# Patient Record
Sex: Female | Born: 1950 | Race: White | Hispanic: No | Marital: Married | Smoking: Never smoker
Health system: Southern US, Community
[De-identification: ages and names within clinical notes are randomized; demographics above are authoritative.]

## PROBLEM LIST (undated history)

## (undated) DIAGNOSIS — M1990 Unspecified osteoarthritis, unspecified site: Secondary | ICD-10-CM

## (undated) DIAGNOSIS — I10 Essential (primary) hypertension: Secondary | ICD-10-CM

## (undated) DIAGNOSIS — T7840XA Allergy, unspecified, initial encounter: Secondary | ICD-10-CM

## (undated) DIAGNOSIS — E785 Hyperlipidemia, unspecified: Secondary | ICD-10-CM

## (undated) DIAGNOSIS — R112 Nausea with vomiting, unspecified: Secondary | ICD-10-CM

## (undated) DIAGNOSIS — Z9889 Other specified postprocedural states: Secondary | ICD-10-CM

## (undated) HISTORY — PX: SPINE SURGERY: SHX786

## (undated) HISTORY — PX: HERNIA REPAIR: SHX51

## (undated) HISTORY — PX: BLADDER SUSPENSION: SHX72

## (undated) HISTORY — DX: Allergy, unspecified, initial encounter: T78.40XA

## (undated) HISTORY — PX: COLONOSCOPY: SHX174

## (undated) HISTORY — PX: COLONOSCOPY W/ BIOPSIES AND POLYPECTOMY: SHX1376

## (undated) HISTORY — PX: EYE SURGERY: SHX253

## (undated) HISTORY — PX: BACK SURGERY: SHX140

## (undated) HISTORY — DX: Essential (primary) hypertension: I10

## (undated) HISTORY — PX: ENDOMETRIAL ABLATION: SHX621

---
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
## 1971-05-24 HISTORY — PX: CHOLECYSTECTOMY: SHX55

## 1977-05-23 HISTORY — PX: TUBAL LIGATION: SHX77

## 1991-05-24 HISTORY — PX: LUMBAR DISC SURGERY: SHX700

## 1999-01-22 HISTORY — PX: MOLE REMOVAL: SHX2046

## 2004-12-23 ENCOUNTER — Ambulatory Visit: Payer: Self-pay | Admitting: Family Medicine

## 2005-03-07 ENCOUNTER — Ambulatory Visit: Payer: Self-pay | Admitting: Family Medicine

## 2005-08-15 ENCOUNTER — Ambulatory Visit: Payer: Self-pay | Admitting: Family Medicine

## 2006-03-20 ENCOUNTER — Ambulatory Visit: Payer: Self-pay | Admitting: Gastroenterology

## 2006-04-06 ENCOUNTER — Ambulatory Visit: Payer: Self-pay | Admitting: Gastroenterology

## 2006-04-28 ENCOUNTER — Ambulatory Visit: Payer: Self-pay | Admitting: Family Medicine

## 2006-10-06 ENCOUNTER — Ambulatory Visit: Payer: Self-pay | Admitting: Family Medicine

## 2011-03-14 ENCOUNTER — Encounter: Payer: Self-pay | Admitting: Internal Medicine

## 2011-05-30 ENCOUNTER — Encounter: Payer: Self-pay | Admitting: Internal Medicine

## 2011-06-27 ENCOUNTER — Ambulatory Visit (AMBULATORY_SURGERY_CENTER): Payer: BC Managed Care – PPO | Admitting: *Deleted

## 2011-06-27 ENCOUNTER — Encounter: Payer: Self-pay | Admitting: Internal Medicine

## 2011-06-27 VITALS — Ht 63.0 in | Wt 172.0 lb

## 2011-06-27 DIAGNOSIS — Z1211 Encounter for screening for malignant neoplasm of colon: Secondary | ICD-10-CM

## 2011-06-27 MED ORDER — PEG-KCL-NACL-NASULF-NA ASC-C 100 G PO SOLR: ORAL | Status: DC

## 2011-06-27 NOTE — Progress Notes (Signed)
No allergy to eggs or soy products 

## 2011-07-11 ENCOUNTER — Encounter: Payer: Self-pay | Admitting: Internal Medicine

## 2011-07-11 ENCOUNTER — Ambulatory Visit (AMBULATORY_SURGERY_CENTER): Payer: BC Managed Care – PPO | Admitting: Internal Medicine

## 2011-07-11 VITALS — BP 131/77 | HR 82 | Temp 96.8°F | Resp 16 | Ht 63.0 in | Wt 172.0 lb

## 2011-07-11 DIAGNOSIS — K5730 Diverticulosis of large intestine without perforation or abscess without bleeding: Secondary | ICD-10-CM

## 2011-07-11 DIAGNOSIS — Z800 Family history of malignant neoplasm of digestive organs: Secondary | ICD-10-CM

## 2011-07-11 DIAGNOSIS — Z1211 Encounter for screening for malignant neoplasm of colon: Secondary | ICD-10-CM

## 2011-07-11 DIAGNOSIS — Z86010 Personal history of colonic polyps: Secondary | ICD-10-CM

## 2011-07-11 NOTE — Patient Instructions (Signed)
YOU HAD AN ENDOSCOPIC PROCEDURE TODAY AT THE [facility]: Refer to the procedure report that was given to you for any specific questions about what was found during the examination.  If the procedure report does not answer your questions, please call your gastroenterologist to clarify.  If you requested that your care partner not be given the details of your procedure findings, then the procedure report has been included in a sealed envelope for you to review at your convenience later.  YOU SHOULD EXPECT: Some feelings of bloating in the abdomen. Passage of more gas than usual.  Walking can help get rid of the air that was put into your GI tract during the procedure and reduce the bloating. If you had a lower endoscopy (such as a colonoscopy or flexible sigmoidoscopy) you may notice spotting of blood in your stool or on the toilet paper. If you underwent a bowel prep for your procedure, then you may not have a normal bowel movement for a few days.  DIET: Your first meal following the procedure should be a light meal and then it is ok to progress to your normal diet.  A half-sandwich or bowl of soup is an example of a good first meal.  Heavy or fried foods are harder to digest and may make you feel nauseous or bloated.  Likewise meals heavy in dairy and vegetables can cause extra gas to form and this can also increase the bloating.  Drink plenty of fluids but you should avoid alcoholic beverages for 24 hours.  ACTIVITY: Your care partner should take you home directly after the procedure.  You should plan to take it easy, moving slowly for the rest of the day.  You can resume normal activity the day after the procedure however you should NOT DRIVE or use heavy machinery for 24 hours (because of the sedation medicines used during the test).    SYMPTOMS TO REPORT IMMEDIATELY: A gastroenterologist can be reached at any hour.  During normal business hours, 8:30 AM to 5:00 PM Monday through Friday,  call [phone].  After hours and on weekends, please call the GI answering service at [phone] who will take a message and have the physician on call contact you.   Following lower endoscopy (colonoscopy or flexible sigmoidoscopy):  Excessive amounts of blood in the stool  Significant tenderness or worsening of abdominal pains  Swelling of the abdomen that is new, acute  Fever of 100F or higher  Following upper endoscopy (EGD)  Vomiting of blood or coffee ground material  New chest pain or pain under the shoulder blades  Painful or persistently difficult swallowing  New shortness of breath  Fever of 100F or higher  Black, tarry-looking stools  FOLLOW UP: If any biopsies were taken you will be contacted by phone or by letter within the next 1-3 weeks.  Call your gastroenterologist if you have not heard about the biopsies in 3 weeks.  Our staff will call the home number listed on your records the next business day following your procedure to check on you and address any questions or concerns that you may have at that time regarding the information given to you following your procedure. This is a courtesy call and so if there is no answer at the home number and we have not heard from you through the emergency physician on call, we will assume that you have returned to your regular daily activities without incident.  SIGNATURES/CONFIDENTIALITY: You and/or your care   partner have signed paperwork which will be entered into your electronic medical record.  These signatures attest to the fact that that the information above on your After Visit Summary has been reviewed and is understood.  Full responsibility of the confidentiality of this discharge information lies with you and/or your care-partner.  

## 2011-07-11 NOTE — Op Note (Signed)
[facility] [address] [place], [place]  [address]  COLONOSCOPY PROCEDURE REPORT  PATIENT:  Maria Briggs, Maria Briggs  MR#:  [id number] BIRTHDATE:  05/27/1950, 60 yrs. old  GENDER:  female ENDOSCOPIST:  Wilhemina Bonito. Eda Keys, MD REF. BY:  Surveillance Program Recall, PROCEDURE DATE:  07/11/2011 PROCEDURE:  Surveillance Colonoscopy ASA CLASS:  Class II INDICATIONS:  history of pre-cancerous (adenomatous) colon polyps, surveillance and high-risk screening, family history of colon cancer ; Father w/ CRC. Patient's index exam 06-2001 (TA); f/u 11-07 MEDICATIONS:   MAC sedation, administered by CRNA, propofol (Diprivan) 240 mg IV  DESCRIPTION OF PROCEDURE:   After the risks benefits and alternatives of the procedure were thoroughly explained, informed consent was obtained.  Digital rectal exam was performed and revealed no abnormalities.   The LB CF-[id number] endoscope was introduced through the anus and advanced to the cecum, which was identified by both the appendix and ileocecal valve, without limitations.  The quality of the prep was excellent, using MoviPrep.  The instrument was then slowly withdrawn as the colon was fully examined. <<PROCEDUREIMAGES>>  FINDINGS:  Severe diverticulosis was found throughout the colon. Otherwise normal colonoscopy without  polyps, masses, vascular ectasias, or inflammatory changes.   Retroflexed views in the rectum revealed internal hemorrhoids.   The time to cecum = 4:56 minutes. The scope was then withdrawn in  8:55  minutes from the cecum and the procedure completed.  COMPLICATIONS:  None  ENDOSCOPIC IMPRESSION: 1) Severe diverticulosis throughout the colon 2) Otherwise normal colonoscopy 3 RECOMMENDATIONS: 1) Follow up colonoscopy in 5 years  ______________________________ Wilhemina Bonito. Eda Keys, MD  CC:  Joette Catching, MD;   The Patient  n. eSIGNED:   Wilhemina Bonito. Eda Keys at 07/11/2011 09:08 AM  Lurena Nida, [id number]

## 2011-07-12 ENCOUNTER — Telehealth: Payer: Self-pay

## 2011-07-12 NOTE — Telephone Encounter (Signed)
  Follow up Call-  Call back number 07/11/2011  Post procedure Call Back phone  # [phone]  Permission to leave phone message Yes     Patient questions:  Do you have a fever, pain , or abdominal swelling? no Pain Score  0 *  Have you tolerated food without any problems? yes  Have you been able to return to your normal activities? yes  Do you have any questions about your discharge instructions: Diet   no Medications  no Follow up visit  no  Do you have questions or concerns about your Care? yes  Actions: * If pain score is 4 or above: No action needed, pain <4.

## 2014-12-08 ENCOUNTER — Other Ambulatory Visit: Payer: Self-pay | Admitting: Surgery

## 2015-02-11 ENCOUNTER — Encounter (HOSPITAL_COMMUNITY)
Admission: RE | Admit: 2015-02-11 | Discharge: 2015-02-11 | Disposition: A | Discharge status: Home / Self Care | Payer: BLUE CROSS/BLUE SHIELD | Source: Ambulatory Visit | Attending: Surgery | Admitting: Surgery

## 2015-02-11 ENCOUNTER — Encounter (HOSPITAL_COMMUNITY): Payer: Self-pay

## 2015-02-11 DIAGNOSIS — Z01812 Encounter for preprocedural laboratory examination: Secondary | ICD-10-CM | POA: Insufficient documentation

## 2015-02-11 DIAGNOSIS — K432 Incisional hernia without obstruction or gangrene: Secondary | ICD-10-CM | POA: Diagnosis not present

## 2015-02-11 HISTORY — DX: Nausea with vomiting, unspecified: R11.2

## 2015-02-11 HISTORY — DX: Hyperlipidemia, unspecified: E78.5

## 2015-02-11 HISTORY — DX: Nausea with vomiting, unspecified: Z98.890

## 2015-02-11 LAB — CBC
HEMATOCRIT: 42.4 % (ref 36.0–46.0)
HEMOGLOBIN: 13.7 g/dL (ref 12.0–15.0)
MCH: 30.1 pg (ref 26.0–34.0)
MCHC: 32.3 g/dL (ref 30.0–36.0)
MCV: 93.2 fL (ref 78.0–100.0)
Platelets: 169 10*3/uL (ref 150–400)
RBC: 4.55 MIL/uL (ref 3.87–5.11)
RDW: 13.4 % (ref 11.5–15.5)
WBC: 5.2 10*3/uL (ref 4.0–10.5)

## 2015-02-11 NOTE — Progress Notes (Signed)
Patient denies any heart cath, ekg, echo, stress test, ever having seen a cardiologist.  pcp is Sherrie Mustache, MD

## 2015-02-11 NOTE — Pre-Procedure Instructions (Signed)
    Maria Briggs  02/11/2015      [place]  [address] Phone: [phone] Fax: [phone]    Your procedure is scheduled on Friday February 20 2015 at 100 pm.  Report to [facility] at 1100 A.M.  Call this number if you have problems the morning of surgery:  [phone]   Remember:  Do not eat food or drink liquids after midnight Thursday September 29th 2016.  Take these medicines the morning of surgery with A SIP OF WATER Colsevelam (Welchol)  STOP: ALL Vitamins, Supplements, Effient and Herbal Medications, Fish Oils, Aspirins, NSAIDs (Nonsteroidal Anti-inflammatories such as Ibuprofen, Aleve, or Advil), and Goody's/BC Powders 7 days prior to surgery, until after surgery as directed by your physician.     Do not wear jewelry, make-up or nail polish.  Do not wear lotions, powders, or perfumes.  You may wear deodorant.  Do not shave 48 hours prior to surgery.    Do not bring valuables to the hospital.  [facility] is not responsible for any belongings or valuables.  Contacts, dentures or bridgework may not be worn into surgery.  Leave your suitcase in the car.  After surgery it may be brought to your room.  For patients admitted to the hospital, discharge time will be determined by your treatment team.  Patients discharged the day of surgery will not be allowed to drive home.   Special instructions:  Please follow these instructions carefully:  1. Shower with CHG Soap the night before surgery and the morning of Surgery. 2. If you choose to wash your hair, wash your hair first as usual with your normal shampoo. 3. After you shampoo, rinse your hair and body thoroughly to remove the Shampoo. 4. Use CHG as you would any other liquid soap. You can apply chg directly to the skin and wash gently with scrungie or a clean  washcloth. 5. Apply the CHG Soap to your body ONLY FROM THE NECK DOWN. Do not use on open wounds or open sores. Avoid contact with your eyes, ears, mouth and genitals (private parts). Wash genitals (private parts) with your normal soap. 6. Wash thoroughly, paying special attention to the area where your surgery will be performed. 7. Thoroughly rinse your body with warm water from the neck down. 8. DO NOT shower/wash with your normal soap after using and rinsing off the CHG Soap. 9. Pat yourself dry with a clean towel.  10. Wear clean pajamas.  11. Place clean sheets on your bed the night of your first shower and do not sleep with pets.  Day of Surgery  Do not apply any lotions/deodorants the morning of surgery. Please wear clean clothes to the hospital/surgery center.    Please read over the following fact sheets that you were given. Pain Booklet, Coughing and Deep Breathing and Surgical Site Infection Prevention

## 2015-02-19 MED ORDER — CIPROFLOXACIN IN D5W 400 MG/200ML IV SOLN
400.0000 mg | INTRAVENOUS | Status: AC
  Administered 2015-02-20: 400 mg via INTRAVENOUS
  Filled 2015-02-19: qty 200

## 2015-02-19 NOTE — H&P (Signed)
Maria Briggs  Location: [facility] Patient #: [id number] DOB: 1951-05-22 Married / Language: English / Race: White Female  History of Present Illness (Maria Briggs A. Ninfa Linden MD; Patient words: abd hernia.  The patient is a 64 year old female who presents with an incisional hernia. She is here today for evaluation of incisional hernia. She is referred by Dr. Dione Housekeeper. She had a laparoscopic cholecystectomy more than 20 years ago. She has developed hernias at the epigastrium and at the umbilicus. She has had them for many years. They're slowly getting larger and causing some mild burning discomfort. She has no obstructive symptoms. The pain does not refer anywhere else. She denies nausea or vomiting. She is otherwise doing well.   Other Problems Elbert Ewings, CMA; Hypercholesterolemia Umbilical Hernia Repair  Past Surgical History Elbert Ewings, CMA; Colon Polyp Removal - Colonoscopy Gallbladder Surgery - Open Oral Surgery Spinal Surgery - Lower Back Tonsillectomy  Diagnostic Studies History Elbert Ewings, CMA;  Colonoscopy 1-5 years ago Mammogram within last year Pap Smear 1-5 years ago  Allergies Elbert Ewings, [place];  Penicillin VK *PENICILLINS* Statins Depletion *DIETARY PRODUCTS/DIETARY MANAGEMENT PRODUCTS*  Medication History Elbert Ewings, CMA; Welchol (625MG  Tablet, Oral) Active. Nitrofurantoin Monohyd Macro (100MG  Capsule, Oral) Active. Fluticasone Propionate (50MCG/ACT Suspension, Nasal) Active. Medications Reconciled  Social History Elbert Ewings, [place]; Alcohol use Occasional alcohol use. No drug use Tobacco use Never smoker.  Family History Elbert Ewings, [place]; Colon Cancer Father. Colon Polyps Father. Prostate Cancer Father.  Pregnancy / Birth History Elbert Ewings, [place];  Age of menopause 51-55 Contraceptive History Oral contraceptives. Para 2  Review of Systems Elbert Ewings CMA;  General Not Present-  Appetite Loss, Chills, Fatigue, Fever, Night Sweats, Weight Gain and Weight Loss. HEENT Present- Wears glasses/contact lenses. Not Present- Earache, Hearing Loss, Hoarseness, Nose Bleed, Oral Ulcers, Ringing in the Ears, Seasonal Allergies, Sinus Pain, Sore Throat, Visual Disturbances and Yellow Eyes. Respiratory Not Present- Bloody sputum, Chronic Cough, Difficulty Breathing, Snoring and Wheezing. Breast Not Present- Breast Mass, Breast Pain, Nipple Discharge and Skin Changes. Gastrointestinal Not Present- Abdominal Pain, Bloating, Bloody Stool, Change in Bowel Habits, Chronic diarrhea, Constipation, Difficulty Swallowing, Excessive gas, Gets full quickly at meals, Hemorrhoids, Indigestion, Nausea, Rectal Pain and Vomiting. Female Genitourinary Not Present- Frequency, Nocturia, Painful Urination, Pelvic Pain and Urgency. Musculoskeletal Not Present- Back Pain, Joint Pain, Joint Stiffness, Muscle Pain, Muscle Weakness and Swelling of Extremities.   Vitals Elbert Ewings CMA;   Weight: 181 lb Height: 63in Body Surface Area: 1.91 m Body Mass Index: 32.06 kg/m Temp.: 98.65F(Oral)  Pulse: 78 (Regular)  BP: 128/70 (Sitting, Left Arm, Standard)    Physical Exam (Stayce Delancy A. Ninfa Linden MD; General Mental Status-Alert. General Appearance-Consistent with stated age. Hydration-Well hydrated. Voice-Normal.  Head and Neck Head-normocephalic, atraumatic with no lesions or palpable masses. Trachea-midline.  Eye Eyeball - Bilateral-Extraocular movements intact. Sclera/Conjunctiva - Bilateral-No scleral icterus.  Chest and Lung Exam Chest and lung exam reveals -quiet, even and easy respiratory effort with no use of accessory muscles and on auscultation, normal breath sounds, no adventitious sounds and normal vocal resonance. Inspection Chest Wall - Normal. Back - normal.  Cardiovascular Cardiovascular examination reveals -normal heart sounds, regular rate and  rhythm with no murmurs and normal pedal pulses bilaterally.  Abdomen Inspection Skin - Scar - no surgical scars. Hernias - Ventral - Reducible. Note: There is an incisional hernia in her epigastrium midway between the xiphoid and the umbilicus which feels like it contains omentum. There is also an incisional hernia  at the umbilicus. Umbilical hernia - Reducible. Palpation/Percussion Palpation and Percussion of the abdomen reveal - Soft, Non Tender, No Rebound tenderness, No Rigidity (guarding) and No hepatosplenomegaly. Auscultation Auscultation of the abdomen reveals - Bowel sounds normal.  Neurologic Neurologic evaluation reveals -alert and oriented x 3 with no impairment of recent or remote memory. Mental Status-Normal.  Musculoskeletal Normal Exam - Left-Upper Extremity Strength Normal and Lower Extremity Strength Normal. Normal Exam - Right-Upper Extremity Strength Normal, Lower Extremity Weakness.    Assessment & Plan (Hillery Zachman A. Ninfa Linden MD  INCISIONAL HERNIA [phone])  Impression: I discussed the diagnosis with her in detail. I discussed surgical repair of the hernia. I discussed laparoscopic incisional hernia repair with mesh. I discussed the risks of surgery which includes but is not limited to bleeding, infection, injury to surrounding structures, the need to convert to an open procedure, recurrence, DVT, cardiopulmonary issues, postoperative recovery, etc. She understands and wishes to proceed with surgery. Educational literature was given.

## 2015-02-20 ENCOUNTER — Ambulatory Visit (HOSPITAL_COMMUNITY): Payer: BLUE CROSS/BLUE SHIELD | Admitting: Certified Registered"

## 2015-02-20 ENCOUNTER — Observation Stay (HOSPITAL_COMMUNITY)
Admission: RE | Admit: 2015-02-20 | Discharge: 2015-02-21 | Disposition: A | Discharge status: Home / Self Care | Payer: BLUE CROSS/BLUE SHIELD | Source: Ambulatory Visit | Attending: Surgery | Admitting: Surgery

## 2015-02-20 ENCOUNTER — Encounter (HOSPITAL_COMMUNITY): Payer: Self-pay | Admitting: *Deleted

## 2015-02-20 ENCOUNTER — Encounter (HOSPITAL_COMMUNITY)
Admission: RE | Disposition: A | Discharge status: Home / Self Care | Payer: Self-pay | Source: Ambulatory Visit | Attending: Surgery

## 2015-02-20 DIAGNOSIS — K432 Incisional hernia without obstruction or gangrene: Secondary | ICD-10-CM | POA: Diagnosis not present

## 2015-02-20 HISTORY — DX: Unspecified osteoarthritis, unspecified site: M19.90

## 2015-02-20 HISTORY — PX: LAPAROSCOPIC INCISIONAL / UMBILICAL / VENTRAL HERNIA REPAIR: SUR789

## 2015-02-20 HISTORY — PX: INCISIONAL HERNIA REPAIR: SHX193

## 2015-02-20 HISTORY — PX: INSERTION OF MESH: SHX5868

## 2015-02-20 SURGERY — REPAIR, HERNIA, INCISIONAL, LAPAROSCOPIC
Anesthesia: General | Site: Abdomen

## 2015-02-20 MED ORDER — MORPHINE SULFATE (PF) 2 MG/ML IV SOLN
1.0000 mg | INTRAVENOUS | Status: DC | PRN
  Administered 2015-02-20: 2 mg via INTRAVENOUS
  Administered 2015-02-20 – 2015-02-21 (×2): 4 mg via INTRAVENOUS
  Filled 2015-02-20: qty 1
  Filled 2015-02-20 (×2): qty 2

## 2015-02-20 MED ORDER — 0.9 % SODIUM CHLORIDE (POUR BTL) OPTIME
TOPICAL | Status: DC | PRN
  Administered 2015-02-20: 1000 mL

## 2015-02-20 MED ORDER — COLESEVELAM HCL 625 MG PO TABS
1875.0000 mg | ORAL_TABLET | Freq: Two times a day (BID) | ORAL | Status: DC
  Administered 2015-02-21: 1875 mg via ORAL
  Filled 2015-02-20 (×2): qty 3

## 2015-02-20 MED ORDER — ONDANSETRON HCL 4 MG/2ML IJ SOLN
4.0000 mg | Freq: Four times a day (QID) | INTRAMUSCULAR | Status: DC | PRN
  Administered 2015-02-20: 4 mg via INTRAVENOUS
  Filled 2015-02-20: qty 2

## 2015-02-20 MED ORDER — MEPERIDINE HCL 25 MG/ML IJ SOLN: 6.2500 mg | INTRAMUSCULAR | Status: DC | PRN

## 2015-02-20 MED ORDER — MIDAZOLAM HCL 2 MG/2ML IJ SOLN
INTRAMUSCULAR | Status: AC
  Filled 2015-02-20: qty 2

## 2015-02-20 MED ORDER — OXYCODONE HCL 5 MG PO TABS
5.0000 mg | ORAL_TABLET | ORAL | Status: DC | PRN
  Administered 2015-02-20 – 2015-02-21 (×3): 10 mg via ORAL
  Filled 2015-02-20 (×3): qty 2

## 2015-02-20 MED ORDER — DEXAMETHASONE SODIUM PHOSPHATE 10 MG/ML IJ SOLN
INTRAMUSCULAR | Status: DC | PRN
  Administered 2015-02-20: 8 mg via INTRAVENOUS

## 2015-02-20 MED ORDER — BUPIVACAINE-EPINEPHRINE 0.25% -1:200000 IJ SOLN
INTRAMUSCULAR | Status: DC | PRN
  Administered 2015-02-20: 20 mL

## 2015-02-20 MED ORDER — OXYCODONE HCL 5 MG PO TABS
ORAL_TABLET | ORAL | Status: AC
  Administered 2015-02-20: 10 mg via ORAL
  Filled 2015-02-20: qty 2

## 2015-02-20 MED ORDER — PROPOFOL 10 MG/ML IV BOLUS
INTRAVENOUS | Status: DC | PRN
  Administered 2015-02-20: 140 mg via INTRAVENOUS

## 2015-02-20 MED ORDER — LIDOCAINE HCL (CARDIAC) 20 MG/ML IV SOLN
INTRAVENOUS | Status: DC | PRN
  Administered 2015-02-20: 80 mg via INTRAVENOUS

## 2015-02-20 MED ORDER — ONDANSETRON HCL 4 MG/2ML IJ SOLN
INTRAMUSCULAR | Status: DC | PRN
  Administered 2015-02-20: 4 mg via INTRAVENOUS

## 2015-02-20 MED ORDER — PHENYLEPHRINE HCL 10 MG/ML IJ SOLN
INTRAMUSCULAR | Status: DC | PRN
  Administered 2015-02-20 (×2): 80 ug via INTRAVENOUS

## 2015-02-20 MED ORDER — ENOXAPARIN SODIUM 40 MG/0.4ML ~~LOC~~ SOLN: 40.0000 mg | SUBCUTANEOUS | Status: DC

## 2015-02-20 MED ORDER — HYDROMORPHONE HCL 1 MG/ML IJ SOLN
0.2500 mg | INTRAMUSCULAR | Status: DC | PRN
  Administered 2015-02-20: 0.25 mg via INTRAVENOUS
  Administered 2015-02-20: 0.5 mg via INTRAVENOUS
  Administered 2015-02-20: 0.25 mg via INTRAVENOUS

## 2015-02-20 MED ORDER — FENTANYL CITRATE (PF) 250 MCG/5ML IJ SOLN
INTRAMUSCULAR | Status: AC
  Filled 2015-02-20: qty 5

## 2015-02-20 MED ORDER — MIDAZOLAM HCL 5 MG/5ML IJ SOLN
INTRAMUSCULAR | Status: DC | PRN
  Administered 2015-02-20: 2 mg via INTRAVENOUS

## 2015-02-20 MED ORDER — POTASSIUM CHLORIDE IN NACL 20-0.9 MEQ/L-% IV SOLN
INTRAVENOUS | Status: DC
  Administered 2015-02-20 – 2015-02-21 (×2): via INTRAVENOUS
  Filled 2015-02-20 (×2): qty 1000

## 2015-02-20 MED ORDER — ACETAMINOPHEN 650 MG RE SUPP: 650.0000 mg | Freq: Four times a day (QID) | RECTAL | Status: DC | PRN

## 2015-02-20 MED ORDER — ONDANSETRON 4 MG PO TBDP: 4.0000 mg | ORAL_TABLET | Freq: Four times a day (QID) | ORAL | Status: DC | PRN

## 2015-02-20 MED ORDER — ROCURONIUM BROMIDE 100 MG/10ML IV SOLN
INTRAVENOUS | Status: DC | PRN
  Administered 2015-02-20: 40 mg via INTRAVENOUS

## 2015-02-20 MED ORDER — HYDROMORPHONE HCL 1 MG/ML IJ SOLN
INTRAMUSCULAR | Status: AC
  Administered 2015-02-20: 0.25 mg via INTRAVENOUS
  Filled 2015-02-20: qty 1

## 2015-02-20 MED ORDER — PROPOFOL 10 MG/ML IV BOLUS
INTRAVENOUS | Status: AC
  Filled 2015-02-20: qty 20

## 2015-02-20 MED ORDER — SUGAMMADEX SODIUM 200 MG/2ML IV SOLN
INTRAVENOUS | Status: DC | PRN
  Administered 2015-02-20: 130 mg via INTRAVENOUS
  Administered 2015-02-20: 200 mg via INTRAVENOUS

## 2015-02-20 MED ORDER — SUGAMMADEX SODIUM 200 MG/2ML IV SOLN
INTRAVENOUS | Status: AC
  Filled 2015-02-20: qty 2

## 2015-02-20 MED ORDER — BUPIVACAINE-EPINEPHRINE (PF) 0.25% -1:200000 IJ SOLN
INTRAMUSCULAR | Status: AC
  Filled 2015-02-20: qty 30

## 2015-02-20 MED ORDER — ROCURONIUM BROMIDE 50 MG/5ML IV SOLN
INTRAVENOUS | Status: AC
  Filled 2015-02-20: qty 1

## 2015-02-20 MED ORDER — ACETAMINOPHEN 325 MG PO TABS: 650.0000 mg | ORAL_TABLET | Freq: Four times a day (QID) | ORAL | Status: DC | PRN

## 2015-02-20 MED ORDER — PROMETHAZINE HCL 25 MG/ML IJ SOLN: 6.2500 mg | INTRAMUSCULAR | Status: DC | PRN

## 2015-02-20 MED ORDER — FENTANYL CITRATE (PF) 100 MCG/2ML IJ SOLN
INTRAMUSCULAR | Status: DC | PRN
  Administered 2015-02-20: 150 ug via INTRAVENOUS
  Administered 2015-02-20 (×2): 50 ug via INTRAVENOUS

## 2015-02-20 MED ORDER — LACTATED RINGERS IV SOLN
INTRAVENOUS | Status: DC
  Administered 2015-02-20 (×2): via INTRAVENOUS

## 2015-02-20 SURGICAL SUPPLY — 42 items
APPLIER CLIP 5 13 M/L LIGAMAX5 (MISCELLANEOUS)
APPLIER CLIP ROT 10 11.4 M/L (STAPLE)
APR CLP MED LRG 11.4X10 (STAPLE)
APR CLP MED LRG 5 ANG JAW (MISCELLANEOUS)
BLADE SURG ROTATE 9660 (MISCELLANEOUS) IMPLANT
CANISTER SUCTION 2500CC (MISCELLANEOUS) ×2 IMPLANT
CHLORAPREP W/TINT 26ML (MISCELLANEOUS) ×3 IMPLANT
CLIP APPLIE 5 13 M/L LIGAMAX5 (MISCELLANEOUS) IMPLANT
CLIP APPLIE ROT 10 11.4 M/L (STAPLE) IMPLANT
COVER SURGICAL LIGHT HANDLE (MISCELLANEOUS) ×3 IMPLANT
DECANTER SPIKE VIAL GLASS SM (MISCELLANEOUS) ×3 IMPLANT
DEVICE SECURE STRAP 25 ABSORB (INSTRUMENTS) ×5 IMPLANT
DEVICE TROCAR PUNCTURE CLOSURE (ENDOMECHANICALS) ×3 IMPLANT
DRAPE LAPAROSCOPIC ABDOMINAL (DRAPES) ×3 IMPLANT
ELECT REM PT RETURN 9FT ADLT (ELECTROSURGICAL) ×3
ELECTRODE REM PT RTRN 9FT ADLT (ELECTROSURGICAL) ×1 IMPLANT
GLOVE SURG SIGNA 7.5 PF LTX (GLOVE) ×3 IMPLANT
GOWN STRL REUS W/ TWL LRG LVL3 (GOWN DISPOSABLE) ×2 IMPLANT
GOWN STRL REUS W/ TWL XL LVL3 (GOWN DISPOSABLE) ×1 IMPLANT
GOWN STRL REUS W/TWL LRG LVL3 (GOWN DISPOSABLE) ×6
GOWN STRL REUS W/TWL XL LVL3 (GOWN DISPOSABLE) ×3
KIT BASIN OR (CUSTOM PROCEDURE TRAY) ×3 IMPLANT
KIT ROOM TURNOVER OR (KITS) ×3 IMPLANT
LIQUID BAND (GAUZE/BANDAGES/DRESSINGS) ×3 IMPLANT
MARKER SKIN DUAL TIP RULER LAB (MISCELLANEOUS) ×3 IMPLANT
MESH VENTRALIGHT ST 7X9N (Mesh General) ×2 IMPLANT
NDL SPNL 22GX3.5 QUINCKE BK (NEEDLE) ×1 IMPLANT
NEEDLE SPNL 22GX3.5 QUINCKE BK (NEEDLE) ×3 IMPLANT
NS IRRIG 1000ML POUR BTL (IV SOLUTION) ×3 IMPLANT
PAD ARMBOARD 7.5X6 YLW CONV (MISCELLANEOUS) ×6 IMPLANT
SCALPEL HARMONIC ACE (MISCELLANEOUS) IMPLANT
SCISSORS LAP 5X35 DISP (ENDOMECHANICALS) ×2 IMPLANT
SET IRRIG TUBING LAPAROSCOPIC (IRRIGATION / IRRIGATOR) IMPLANT
SLEEVE ENDOPATH XCEL 5M (ENDOMECHANICALS) ×5 IMPLANT
SUT MON AB 4-0 PC3 18 (SUTURE) ×3 IMPLANT
SUT NOVA NAB GS-21 0 18 T12 DT (SUTURE) ×3 IMPLANT
TOWEL OR 17X24 6PK STRL BLUE (TOWEL DISPOSABLE) ×3 IMPLANT
TRAY FOLEY CATH 16FR SILVER (SET/KITS/TRAYS/PACK) IMPLANT
TRAY LAPAROSCOPIC MC (CUSTOM PROCEDURE TRAY) ×3 IMPLANT
TROCAR XCEL NON-BLD 11X100MML (ENDOMECHANICALS) ×3 IMPLANT
TROCAR XCEL NON-BLD 5MMX100MML (ENDOMECHANICALS) ×3 IMPLANT
TUBING INSUFFLATION (TUBING) ×3 IMPLANT

## 2015-02-20 NOTE — Anesthesia Preprocedure Evaluation (Addendum)
Anesthesia Evaluation  Patient identified by MRN, date of birth, ID band Patient awake    Reviewed: Allergy & Precautions, NPO status , Patient's Chart, lab work & pertinent test results  History of Anesthesia Complications (+) PONV and history of anesthetic complications  Airway Mallampati: II  TM Distance: >3 FB Neck ROM: Full    Dental no notable dental hx. (+) Teeth Intact, Dental Advisory Given   Pulmonary neg pulmonary ROS,    Pulmonary exam normal breath sounds clear to auscultation       Cardiovascular negative cardio ROS Normal cardiovascular exam Rhythm:Regular Rate:Normal     Neuro/Psych negative neurological ROS  negative psych ROS   GI/Hepatic negative GI ROS, Neg liver ROS,   Endo/Other  negative endocrine ROS  Renal/GU negative Renal ROS     Musculoskeletal negative musculoskeletal ROS (+)   Abdominal   Peds  Hematology negative hematology ROS (+)   Anesthesia Other Findings   Reproductive/Obstetrics negative OB ROS                            Anesthesia Physical Anesthesia Plan  ASA: II  Anesthesia Plan: General   Post-op Pain Management:    Induction: Intravenous  Airway Management Planned: Oral ETT  Additional Equipment:   Intra-op Plan:   Post-operative Plan: Extubation in OR  Informed Consent: I have reviewed the patients History and Physical, chart, labs and discussed the procedure including the risks, benefits and alternatives for the proposed anesthesia with the patient or authorized representative who has indicated his/her understanding and acceptance.   Dental advisory given  Plan Discussed with: CRNA  Anesthesia Plan Comments:         Anesthesia Quick Evaluation

## 2015-02-20 NOTE — Op Note (Signed)
LAPAROSCOPIC INCISIONAL HERNIA REPAIR WITH MESH, INSERTION OF MESH  Procedure Note  Maria Briggs 02/20/2015   Pre-op Diagnosis: Incisional Hernia     Post-op Diagnosis: same  Procedure(s): LAPAROSCOPIC INCISIONAL HERNIA REPAIR WITH MESH INSERTION OF MESH (17 cm x 20 cm Ventralite)  Surgeon(s): Coralie Keens, MD  Anesthesia: General  Staff:  Circulator: Tereasa Coop, RN Scrub Person: Imagene Gurney; Cyd Silence, RN Circulator Assistant: Cyd Silence, RN  Estimated Blood Loss: Minimal                         Coralie Keens A   Date: 02/20/2015  Time: 2:40 PM

## 2015-02-20 NOTE — Interval H&P Note (Signed)
History and Physical Interval Note: no change in H and P  02/20/2015 12:42 PM  Maria Briggs  has presented today for surgery, with the diagnosis of Incisional Hernia  The various methods of treatment have been discussed with the patient and family. After consideration of risks, benefits and other options for treatment, the patient has consented to  Procedure(s): [place] (N/A) INSERTION OF MESH (N/A) as a surgical intervention .  The patient's history has been reviewed, patient examined, no change in status, stable for surgery.  I have reviewed the patient's chart and labs.  Questions were answered to the patient's satisfaction.     BLACKMAN,DOUGLAS A

## 2015-02-20 NOTE — Anesthesia Postprocedure Evaluation (Signed)
  Anesthesia Post-op Note  Patient: Maria Briggs  Procedure(s) Performed: Procedure(s) (LRB): LAPAROSCOPIC INCISIONAL HERNIA REPAIR WITH MESH (N/A) INSERTION OF MESH (N/A)  Patient Location: PACU  Anesthesia Type: General  Level of Consciousness: awake and alert   Airway and Oxygen Therapy: Patient Spontanous Breathing  Post-op Pain: mild  Post-op Assessment: Post-op Vital signs reviewed, Patient's Cardiovascular Status Stable, Respiratory Function Stable, Patent Airway and No signs of Nausea or vomiting  Last Vitals:  Filed Vitals:   02/20/15 1545  BP:   Pulse: 70  Temp:   Resp: 12    Post-op Vital Signs: stable   Complications: No apparent anesthesia complications

## 2015-02-20 NOTE — Transfer of Care (Signed)
Immediate Anesthesia Transfer of Care Note  Patient: Maria Briggs  Procedure(s) Performed: Procedure(s): LAPAROSCOPIC INCISIONAL HERNIA REPAIR WITH MESH (N/A) INSERTION OF MESH (N/A)  Patient Location: PACU  Anesthesia Type:General  Level of Consciousness: sedated  Airway & Oxygen Therapy: Patient Spontanous Breathing and Patient connected to nasal cannula oxygen  Post-op Assessment: Report given to RN and Post -op Vital signs reviewed and stable  Post vital signs: stable  Last Vitals:  Filed Vitals:   02/20/15 1209  BP:   Pulse:   Temp: 37.1 C  Resp:     Complications: No apparent anesthesia complications

## 2015-02-20 NOTE — Anesthesia Procedure Notes (Signed)
Procedure Name: Intubation Date/Time: 02/20/2015 1:56 PM Performed by: Lavell Luster Pre-anesthesia Checklist: Patient identified, Emergency Drugs available, Suction available, Patient being monitored and Timeout performed Patient Re-evaluated:Patient Re-evaluated prior to inductionOxygen Delivery Method: Circle system utilized Preoxygenation: Pre-oxygenation with 100% oxygen Intubation Type: IV induction Ventilation: Mask ventilation without difficulty Laryngoscope Size: Mac and 3 Grade View: Grade I Tube type: Oral Tube size: 7.5 mm Number of attempts: 1 Airway Equipment and Method: Stylet Placement Confirmation: ETT inserted through vocal cords under direct vision,  positive ETCO2 and breath sounds checked- equal and bilateral Secured at: 22 cm Tube secured with: Tape Dental Injury: Teeth and Oropharynx as per pre-operative assessment

## 2015-02-21 DIAGNOSIS — K432 Incisional hernia without obstruction or gangrene: Secondary | ICD-10-CM | POA: Diagnosis not present

## 2015-02-21 MED ORDER — OXYCODONE HCL 5 MG PO TABS: 5.0000 mg | ORAL_TABLET | ORAL | Status: DC | PRN

## 2015-02-21 NOTE — Discharge Instructions (Signed)
[facility] ______[facility] LAPAROSCOPIC SURGERY: POST OP INSTRUCTIONS Always review your discharge instruction sheet given to you by the facility where your surgery was performed. IF YOU HAVE DISABILITY OR FAMILY LEAVE FORMS, YOU MUST BRING THEM TO THE OFFICE FOR PROCESSING.   DO NOT GIVE THEM TO YOUR DOCTOR.  1. A prescription for pain medication may be given to you upon discharge.  Take your pain medication as prescribed, if needed.  If narcotic pain medicine is not needed, then you may take acetaminophen (Tylenol) or ibuprofen (Advil) as needed. 2. Take your usually prescribed medications unless otherwise directed. 3. If you need a refill on your pain medication, please contact your pharmacy.  They will contact our office to request authorization. Prescriptions will not be filled after 5pm or on week-ends. 4. You should follow a light diet the first few days after arrival home, such as soup and crackers, etc.  Be sure to include lots of fluids daily. 5. Most patients will experience some swelling and bruising in the area of the incisions.  Ice packs will help.  Swelling and bruising can take several days to resolve.  6. It is common to experience some constipation if taking pain medication after surgery.  Increasing fluid intake and taking a stool softener (such as Colace) will usually help or prevent this problem from occurring.  A mild laxative (Milk of Magnesia or Miralax) should be taken according to package instructions if there are no bowel movements after 48 hours. 7. Unless discharge instructions indicate otherwise, you may remove your bandages 24-48 hours after surgery, and you may shower at that time.  You may have steri-strips (small skin tapes) in place directly over the incision.  These strips should be left on the skin for 7-10 days.  If your surgeon used skin glue on the incision, you may shower in 24 hours.  The glue will flake off over the next 2-3 weeks.  Any sutures or  staples will be removed at the office during your follow-up visit. 8. ACTIVITIES:  You may resume regular (light) daily activities beginning the next day--such as daily self-care, walking, climbing stairs--gradually increasing activities as tolerated.  You may have sexual intercourse when it is comfortable.  Refrain from any heavy lifting or straining until approved by your doctor. a. You may drive when you are no longer taking prescription pain medication, you can comfortably wear a seatbelt, and you can safely maneuver your car and apply brakes. b. RETURN TO WORK:  __ONE WEEK________________________________________________________ 9. You should see your doctor in the office for a follow-up appointment approximately 2-3 weeks after your surgery.  Make sure that you call for this appointment within a day or two after you arrive home to insure a convenient appointment time. 10. OTHER INSTRUCTIONS: __NO LIFTING MORE THAN 15 TO 20 POUNDS FOR 4 WEEKS 11. OK TO SHOWER TODAY________________________________________________________________________________________________________________________ __________________________________________________________________________________________________________________________ WHEN TO CALL YOUR DOCTOR: 1. Fever over 101.0 2. Inability to urinate 3. Continued bleeding from incision. 4. Increased pain, redness, or drainage from the incision. 5. Increasing abdominal pain  The clinic staff is available to answer your questions during regular business hours.  Please dont hesitate to call and ask to speak to one of the nurses for clinical concerns.  If you have a medical emergency, go to the nearest emergency room or call 911.  A surgeon from [facility] is always on call at the hospital. [address] [place], [place], [place]  [address] ? [address] [place], [place]   [address] [phone] ? [phone] ? FAX [phone] Web site:  [email or web address]

## 2015-02-21 NOTE — Progress Notes (Signed)
Patient ID: Maria Briggs, female   DOB: 1951/04/23, 64 y.o.   MRN: [id number]  Doing well Pain controlled abd soft  Plan: discharge

## 2015-02-21 NOTE — Op Note (Signed)
NAMEMarland Briggs  ZAHRAH, SUTHERLIN NO.:  [id number]  MEDICAL RECORD NO.:  [id number]  LOCATION:  [id number]                        FACILITY:  [facility]  PHYSICIAN:  Coralie Keens, M.D. DATE OF BIRTH:  1951/01/15  DATE OF PROCEDURE:  02/20/2015 DATE OF DISCHARGE:                              OPERATIVE REPORT   POSTOPERATIVE DIAGNOSIS:  Incisional hernia.  POSTOPERATIVE DIAGNOSIS:  Incisional hernia.  PROCEDURE:  Laparoscopic incisional hernia repair with mesh.  SURGEON:  Coralie Keens, M.D.  ANESTHESIA:  General and 0.5% Marcaine.  ESTIMATED BLOOD LOSS:  Minimal.  INDICATIONS:  This is a 64 year old female who was found to have an incisional hernia at the umbilicus as well as in her epigastrium. Decision was made to proceed with laparoscopic repair with mesh.  FINDINGS:  The patient was found to have 2 fascial defects, 1 being at the umbilicus and 1 being in the epigastrium.  There was only omentum involved with the epigastric hernia and no other scar tissue or involved bowel.  The entire defects were repaired with a 17 cm x 22 cm piece of Ventralight Prolene mesh from [employer].  PROCEDURE IN DETAIL:  The patient was brought to operating room, identified as Maria Briggs.  She was placed supine on the operating table and general anesthesia was induced.  Her abdomen was then prepped and draped in usual sterile fashion.  I made a small incision on the patient's left upper quadrant and used a 5-mm trocar and OptiView camera to traverse all layers of the abdominal wall, gaining entrance into the abdominal cavity.  Insufflation was then begun with carbon dioxide.  I then placed an 11 mm port in the patient's left lower quadrant.  I evaluated the hernia defects.  There was a fascial defect at the umbilicus as well as 1 in the epigastrium.  I had to take down omentum from the abdominal wall with the laparoscopic scissors and take down the falciform ligament as well.  No other  incisional hernias were seen.  The defect at the umbilicus was approximately 2 cm and the defect in the epigastrium was approximately 4 cm.  I measured both areas combined and brought a 17 x 22 cm Ventralight laparoscopic Prolene mesh onto the field.  I placed 4 separate #1 Novafil stay sutures into the mesh.  I then rolled the mesh and soaked in saline and inserted through the 11 mm trocar.  I then unrolled the mesh.  I made 4 separate stab incisions and using the suture passer, pulled the sutures up to the abdominal wall under direct vision.  This allowed the mesh to be pulled up taut against the peritoneum.  I then had to make 2 other incisions in the patient's right upper and right mid quadrants and placed 2 more 5 mm trocars and then tacked the mesh in circumferentially with the absorbable tacker. Wide coverage of both facial defects appeared to be achieved.  At this point, hemostasis also appeared to be achieved.  I again evaluated the omentum and saw no evidence of bleeding.  All ports then were removed under direct vision and the abdomen was deflated.  All incisions were then anesthetized with Marcaine and closed with 4-0  Monocryl sutures and skin glue.  The patient tolerated the procedure well.  All the counts were correct at the end of the procedure.  The patient was then extubated in the operating room and taken in a stable condition to the recovery room.     Coralie Keens, M.D.     DB/MEDQ  D:  02/20/2015  T:  02/21/2015  Job:  [id number]

## 2015-02-21 NOTE — Discharge Summary (Signed)
Physician Discharge Summary  Patient ID: SIRA ADSIT MRN: [id number] DOB/AGE: Sep 16, 1950 64 y.o.  Admit date: 02/20/2015 Discharge date: 02/21/2015  Admission Diagnoses:  Discharge Diagnoses:  Active Problems:   Incisional hernia   Discharged Condition: good  Hospital Course: Uneventful post op recovery.  Discharged POD#1  Consults: None  Significant Diagnostic Studies:   Treatments: surgery: laparoscopic ventral incisional hernia repair with mesh  Discharge Exam: Blood pressure 120/69, pulse 80, temperature 97.8 F (36.6 C), temperature source Oral, resp. rate 17, height 5\' 3"  (1.6 m), weight 82.555 kg (182 lb), SpO2 94 %. General appearance: alert, cooperative and no distress Resp: clear to auscultation bilaterally Cardio: regular rate and rhythm, S1, S2 normal, no murmur, click, rub or gallop Incision/Wound:abdomen soft, incisions clean  Disposition: Final discharge disposition not confirmed     Medication List    TAKE these medications        aspirin 81 MG tablet  Take 81 mg by mouth daily.     CALCIUM PO  Take 600 mg by mouth daily.     colesevelam 625 MG tablet  Commonly known as:  WELCHOL  Take 1,875 mg by mouth 2 (two) times daily with a meal.     FISH OIL PO  Take 1,000 mg by mouth 2 (two) times daily.     oxyCODONE 5 MG immediate release tablet  Commonly known as:  Oxy IR/ROXICODONE  Take 1-2 tablets (5-10 mg total) by mouth every 4 (four) hours as needed for moderate pain.           Follow-up Information    Follow up with [facility], MD. Schedule an appointment as soon as possible for a visit in 4 weeks.   Specialty:  General Surgery   Contact information:   [place] [address] [phone]       Signed: Harl Bowie 02/21/2015, 6:42 AM

## 2015-02-22 ENCOUNTER — Encounter (HOSPITAL_COMMUNITY): Payer: Self-pay | Admitting: Surgery

## 2016-02-11 DIAGNOSIS — R319 Hematuria, unspecified: Secondary | ICD-10-CM

## 2016-02-11 DIAGNOSIS — N390 Urinary tract infection, site not specified: Secondary | ICD-10-CM | POA: Insufficient documentation

## 2016-02-11 DIAGNOSIS — R309 Painful micturition, unspecified: Secondary | ICD-10-CM | POA: Insufficient documentation

## 2016-03-14 DIAGNOSIS — N3000 Acute cystitis without hematuria: Secondary | ICD-10-CM | POA: Insufficient documentation

## 2016-03-14 DIAGNOSIS — N952 Postmenopausal atrophic vaginitis: Secondary | ICD-10-CM | POA: Insufficient documentation

## 2016-06-01 ENCOUNTER — Encounter: Payer: Self-pay | Admitting: Internal Medicine

## 2016-09-29 DIAGNOSIS — Z87440 Personal history of urinary (tract) infections: Secondary | ICD-10-CM | POA: Insufficient documentation

## 2017-01-24 ENCOUNTER — Encounter: Payer: Self-pay | Admitting: Internal Medicine

## 2017-03-24 ENCOUNTER — Ambulatory Visit (AMBULATORY_SURGERY_CENTER): Payer: Self-pay | Admitting: *Deleted

## 2017-03-24 VITALS — Ht 63.0 in | Wt 190.0 lb

## 2017-03-24 DIAGNOSIS — Z800 Family history of malignant neoplasm of digestive organs: Secondary | ICD-10-CM

## 2017-03-24 DIAGNOSIS — Z86010 Personal history of colonic polyps: Secondary | ICD-10-CM

## 2017-03-24 MED ORDER — NA SULFATE-K SULFATE-MG SULF 17.5-3.13-1.6 GM/177ML PO SOLN: ORAL | 0 refills | Status: DC

## 2017-03-24 NOTE — Progress Notes (Signed)
Patient denies any allergies to eggs or soy. Patient has post-op n&v with anesthesia. Patient denies any oxygen use at home. Patient denies taking any diet/weight loss medications or blood thinners. EMMI education assisgned to patient on colonoscopy, this was explained and instructions given to patient.

## 2017-03-29 ENCOUNTER — Encounter: Payer: Self-pay | Admitting: Internal Medicine

## 2017-04-07 ENCOUNTER — Ambulatory Visit (AMBULATORY_SURGERY_CENTER): Payer: BLUE CROSS/BLUE SHIELD | Admitting: Internal Medicine

## 2017-04-07 ENCOUNTER — Other Ambulatory Visit: Payer: Self-pay

## 2017-04-07 ENCOUNTER — Encounter: Payer: Self-pay | Admitting: Internal Medicine

## 2017-04-07 VITALS — BP 120/57 | HR 81 | Temp 98.0°F | Resp 11 | Ht 63.0 in | Wt 190.0 lb

## 2017-04-07 DIAGNOSIS — Z86010 Personal history of colonic polyps: Secondary | ICD-10-CM

## 2017-04-07 DIAGNOSIS — Z800 Family history of malignant neoplasm of digestive organs: Secondary | ICD-10-CM

## 2017-04-07 MED ORDER — SODIUM CHLORIDE 0.9 % IV SOLN: 500.0000 mL | INTRAVENOUS | Status: DC

## 2017-04-07 NOTE — Op Note (Signed)
[facility] Patient Name: Maria Briggs Procedure Date: 04/07/2017 8:43 AM MRN: [id number] Endoscopist: Docia Chuck. Maria Briggs , MD Age: 66 Referring MD:  Date of Birth: Oct 13, 1950 Gender: Female Account #: [id number] Procedure:                Colonoscopy Indications:              High risk colon cancer surveillance: Personal                            history of non-advanced adenoma. Father with colon                            cancer under the age of 35. Previous examinations                            1993, 2003, 2007, and 2013 Medicines:                Monitored Anesthesia Care Procedure:                Pre-Anesthesia Assessment:                           - Prior to the procedure, a History and Physical                            was performed, and patient medications and                            allergies were reviewed. The patient's tolerance of                            previous anesthesia was also reviewed. The risks                            and benefits of the procedure and the sedation                            options and risks were discussed with the patient.                            All questions were answered, and informed consent                            was obtained. Prior Anticoagulants: The patient has                            taken no previous anticoagulant or antiplatelet                            agents. ASA Grade Assessment: II - A patient with                            mild systemic disease. After reviewing the risks  and benefits, the patient was deemed in                            satisfactory condition to undergo the procedure.                           After obtaining informed consent, the colonoscope                            was passed under direct vision. Throughout the                            procedure, the patient's blood pressure, pulse, and                            oxygen saturations were monitored  continuously. The                            Colonoscope was introduced through the anus and                            advanced to the the cecum, identified by                            appendiceal orifice and ileocecal valve. The                            ileocecal valve, appendiceal orifice, and rectum                            were photographed. The quality of the bowel                            preparation was good. The colonoscopy was performed                            without difficulty. The patient tolerated the                            procedure well. The bowel preparation used was                            SUPREP. Scope In: 8:57:10 AM Scope Out: 9:11:51 AM Scope Withdrawal Time: 0 hours 11 minutes 14 seconds  Total Procedure Duration: 0 hours 14 minutes 41 seconds  Findings:                 Multiple small and large-mouthed diverticula were                            found in the entire colon.                           The exam was otherwise without abnormality on  direct and retroflexion views. Complications:            No immediate complications. Estimated blood loss:                            None. Estimated Blood Loss:     Estimated blood loss: none. Impression:               - Diverticulosis in the entire examined colon.                           - The examination was otherwise normal on direct                            and retroflexion views.                           - No specimens collected. Recommendation:           - Repeat colonoscopy in 5 years for surveillance.                           - Patient has a contact number available for                            emergencies. The signs and symptoms of potential                            delayed complications were discussed with the                            patient. Return to normal activities tomorrow.                            Written discharge instructions were provided to the                             patient.                           - Resume previous diet.                           - Continue present medications. Docia Chuck. Maria Pastor, MD 04/07/2017 9:17:34 AM This report has been signed electronically.

## 2017-04-07 NOTE — Progress Notes (Signed)
Report to PACU, RN, vss, BBS= Clear.  

## 2017-04-07 NOTE — Patient Instructions (Signed)
YOU HAD AN ENDOSCOPIC PROCEDURE TODAY AT [facility]:   Refer to the procedure report that was given to you for any specific questions about what was found during the examination.  If the procedure report does not answer your questions, please call your gastroenterologist to clarify.  If you requested that your care partner not be given the details of your procedure findings, then the procedure report has been included in a sealed envelope for you to review at your convenience later.  YOU SHOULD EXPECT: Some feelings of bloating in the abdomen. Passage of more gas than usual.  Walking can help get rid of the air that was put into your GI tract during the procedure and reduce the bloating. If you had a lower endoscopy (such as a colonoscopy or flexible sigmoidoscopy) you may notice spotting of blood in your stool or on the toilet paper. If you underwent a bowel prep for your procedure, you may not have a normal bowel movement for a few days.  Please Note:  You might notice some irritation and congestion in your nose or some drainage.  This is from the oxygen used during your procedure.  There is no need for concern and it should clear up in a day or so.  SYMPTOMS TO REPORT IMMEDIATELY:   Following lower endoscopy (colonoscopy or flexible sigmoidoscopy):  Excessive amounts of blood in the stool  Significant tenderness or worsening of abdominal pains  Swelling of the abdomen that is new, acute  Fever of 100F or higher For urgent or emergent issues, a gastroenterologist can be reached at any hour by calling [phone].   DIET:  We do recommend a small meal at first, but then you may proceed to your regular diet.  Drink plenty of fluids but you should avoid alcoholic beverages for 24 hours.  ACTIVITY:  You should plan to take it easy for the rest of today and you should NOT DRIVE or use heavy machinery until tomorrow (because of the sedation medicines used during the test).     FOLLOW UP: Our staff will call the number listed on your records the next business day following your procedure to check on you and address any questions or concerns that you may have regarding the information given to you following your procedure. If we do not reach you, we will leave a message.  However, if you are feeling well and you are not experiencing any problems, there is no need to return our call.  We will assume that you have returned to your regular daily activities without incident.  If any biopsies were taken you will be contacted by phone or by letter within the next 1-3 weeks.  Please call us at [phone] if you have not heard about the biopsies in 3 weeks.  Diverticulosis and high fiber diet information given.  SIGNATURES/CONFIDENTIALITY: You and/or your care partner have signed paperwork which will be entered into your electronic medical record.  These signatures attest to the fact that that the information above on your After Visit Summary has been reviewed and is understood.  Full responsibility of the confidentiality of this discharge information lies with you and/or your care-partner.

## 2017-04-07 NOTE — Progress Notes (Signed)
Pt's states no medical or surgical changes since previsit or office visit. 

## 2017-04-10 ENCOUNTER — Telehealth: Payer: Self-pay | Admitting: *Deleted

## 2017-04-10 NOTE — Telephone Encounter (Signed)
  Follow up Call-  Call back number 04/07/2017  Post procedure Call Back phone  # [phone]  Permission to leave phone message Yes  Some recent data might be hidden     Patient questions:  Do you have a fever, pain , or abdominal swelling? No. Pain Score  0 *  Have you tolerated food without any problems? Yes.    Have you been able to return to your normal activities? Yes.    Do you have any questions about your discharge instructions: Diet   No. Medications  No. Follow up visit  No.  Do you have questions or concerns about your Care? No.  Actions: * If pain score is 4 or above: No action needed, pain <4.

## 2018-12-12 DIAGNOSIS — Z87440 Personal history of urinary (tract) infections: Secondary | ICD-10-CM | POA: Diagnosis not present

## 2018-12-31 ENCOUNTER — Other Ambulatory Visit: Payer: Self-pay

## 2019-01-01 ENCOUNTER — Encounter: Payer: Self-pay | Admitting: Family Medicine

## 2019-01-01 ENCOUNTER — Telehealth: Payer: Self-pay | Admitting: Family Medicine

## 2019-01-01 ENCOUNTER — Ambulatory Visit (INDEPENDENT_AMBULATORY_CARE_PROVIDER_SITE_OTHER): Payer: Medicare Other | Admitting: Family Medicine

## 2019-01-01 VITALS — BP 158/88 | HR 100 | Temp 98.6°F | Ht 63.0 in | Wt 173.0 lb

## 2019-01-01 DIAGNOSIS — M25551 Pain in right hip: Secondary | ICD-10-CM | POA: Diagnosis not present

## 2019-01-01 DIAGNOSIS — G8929 Other chronic pain: Secondary | ICD-10-CM

## 2019-01-01 DIAGNOSIS — Z7689 Persons encountering health services in other specified circumstances: Secondary | ICD-10-CM | POA: Diagnosis not present

## 2019-01-01 DIAGNOSIS — R030 Elevated blood-pressure reading, without diagnosis of hypertension: Secondary | ICD-10-CM | POA: Insufficient documentation

## 2019-01-01 DIAGNOSIS — M25552 Pain in left hip: Secondary | ICD-10-CM

## 2019-01-01 DIAGNOSIS — M25512 Pain in left shoulder: Secondary | ICD-10-CM | POA: Diagnosis not present

## 2019-01-01 DIAGNOSIS — G5602 Carpal tunnel syndrome, left upper limb: Secondary | ICD-10-CM

## 2019-01-01 DIAGNOSIS — M25511 Pain in right shoulder: Secondary | ICD-10-CM

## 2019-01-01 NOTE — Telephone Encounter (Signed)
Thank you.  I will note this in her chart.

## 2019-01-01 NOTE — Patient Instructions (Signed)
You had labs performed today.  You will be contacted with the results of the labs once they are available, usually in the next 3 business days for routine lab work.  If you have an active my chart account, they will be released to your MyChart.  If you prefer to have these labs released to you via telephone, please let us know.  Start using a rigid brace at bedtime on the left.  This should help with the symptoms you are having.  If it does not improve, we could refer you to the orthopedist for injection therapy.  Carpal Tunnel Syndrome  Carpal tunnel syndrome is a condition that causes pain in your hand and arm. The carpal tunnel is a narrow area located on the palm side of your wrist. Repeated wrist motion or certain diseases may cause swelling within the tunnel. This swelling pinches the main nerve in the wrist (median nerve). What are the causes? This condition may be caused by:  Repeated wrist motions.  Wrist injuries.  Arthritis.  A cyst or tumor in the carpal tunnel.  Fluid buildup during pregnancy. Sometimes the cause of this condition is not known. What increases the risk? The following factors may make you more likely to develop this condition:  Having a job, such as being a Research scientist (life sciences), that requires you to repeatedly move your wrist in the same motion.  Being a woman.  Having certain conditions, such as: ? Diabetes. ? Obesity. ? An underactive thyroid (hypothyroidism). ? Kidney failure. What are the signs or symptoms? Symptoms of this condition include:  A tingling feeling in your fingers, especially in your thumb, index, and middle fingers.  Tingling or numbness in your hand.  An aching feeling in your entire arm, especially when your wrist and elbow are bent for a long time.  Wrist pain that goes up your arm to your shoulder.  Pain that goes down into your palm or fingers.  A weak feeling in your hands. You may have trouble grabbing and holding  items. Your symptoms may feel worse during the night. How is this diagnosed? This condition is diagnosed with a medical history and physical exam. You may also have tests, including:  Electromyogram (EMG). This test measures electrical signals sent by your nerves into the muscles.  Nerve conduction study. This test measures how well electrical signals pass through your nerves.  Imaging tests, such as X-rays, ultrasound, and MRI. These tests check for possible causes of your condition. How is this treated? This condition may be treated with:  Lifestyle changes. It is important to stop or change the activity that caused your condition.  Doing exercise and activities to strengthen your muscles and bones (physical therapy).  Learning how to use your hand again after diagnosis (occupational therapy).  Medicines for pain and inflammation. This may include medicine that is injected into your wrist.  A wrist splint.  Surgery. Follow these instructions at home: If you have a splint:  Wear the splint as told by your health care provider. Remove it only as told by your health care provider.  Loosen the splint if your fingers tingle, become numb, or turn cold and blue.  Keep the splint clean.  If the splint is not waterproof: ? Do not let it get wet. ? Cover it with a watertight covering when you take a bath or shower. Managing pain, stiffness, and swelling   If directed, put ice on the painful area: ? If you have  a removable splint, remove it as told by your health care provider. ? Put ice in a plastic bag. ? Place a towel between your skin and the bag. ? Leave the ice on for 20 minutes, 2-3 times per day. General instructions  Take over-the-counter and prescription medicines only as told by your health care provider.  Rest your wrist from any activity that may be causing your pain. If your condition is work related, talk with your employer about changes that can be made, such as  getting a wrist pad to use while typing.  Do any exercises as told by your health care provider, physical therapist, or occupational therapist.  Keep all follow-up visits as told by your health care provider. This is important. Contact a health care provider if:  You have new symptoms.  Your pain is not controlled with medicines.  Your symptoms get worse. Get help right away if:  You have severe numbness or tingling in your wrist or hand. Summary  Carpal tunnel syndrome is a condition that causes pain in your hand and arm.  It is usually caused by repeated wrist motions.  Lifestyle changes and medicines are used to treat carpal tunnel syndrome. Surgery may be recommended.  Follow your health care provider's instructions about wearing a splint, resting from activity, keeping follow-up visits, and calling for help. This information is not intended to replace advice given to you by your health care provider. Make sure you discuss any questions you have with your health care provider. Document Released: 05/06/2000 Document Revised: 09/15/2017 Document Reviewed: 09/15/2017 Elsevier Patient Education  2020 [employer].

## 2019-01-01 NOTE — Progress Notes (Signed)
Subjective: [id number] care, arthralgias HPI: Maria Briggs is a 68 y.o. female presenting to clinic today for:  1.  Arthralgia Patient reports bilateral shoulder and hip pain.  She notes that symptoms seem to be most prevalent at night and in the early morning hours.  After she is able to move around symptoms seem to get better.  She had a similar episode about 10 years ago after treatment with a statin.  She notes that she was subsequently treated with prednisone for about a year and was referred to rheumatology.  She cannot remember the name of the rheumatologist she saw but notes that it was in [place] near the [facility].  She was ultimately tapered from the prednisone.  She is had no recurrent issues until the last couple of months.  She notes that she was taking WelChol until about 6 weeks ago and then she self discontinued as symptoms seem to get worse.  She continues to have symptoms despite being off of the medication over the last month and a half but they seem less severe.  She thinks that her grandmother had a history of rheumatoid arthritis that she remembers disfigurement in the hands.  No other known autoimmune disease.  2.  Left wrist pain Patient reports longstanding history of left-sided wrist pain.  She notes intermittent seems predominantly occurring at nighttime.  She does bend her wrist quite a bit.  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  She describes numbness and tingling in the left hand.  It resolves without any oral or topical treatments.  She is not using a brace.  3. Elevated blood pressure reading Patient reports good control blood pressure at home with blood pressures typically in the 120s over 70s.  No chest pain, shortness of breath, dizziness.  Not currently on any treatment for hypertension.  There is significant cardiovascular disease within the family, including a brother who died of a  massive heart attack at age 74.  Both of her paternal grandparents also had strokes.  Past Medical History:  Diagnosis Date  . Arthritis    "maybe a little; comes & goes; various parts of my body" (02/20/2015)  . Hyperlipidemia   . PONV (postoperative nausea and vomiting)    Past Surgical History:  Procedure Laterality Date  . BACK SURGERY    . BLADDER SUSPENSION  ~ 2005  . CHOLECYSTECTOMY  1973  . COLONOSCOPY    . COLONOSCOPY W/ BIOPSIES AND POLYPECTOMY    . ENDOMETRIAL ABLATION  2006?  Marland Kitchen HERNIA REPAIR    . INCISIONAL HERNIA REPAIR N/A 02/20/2015   Procedure: LAPAROSCOPIC INCISIONAL HERNIA REPAIR WITH MESH;  Surgeon: Coralie Keens, MD;  Location: [facility];  Service: General;  Laterality: N/A;  . INSERTION OF MESH N/A 02/20/2015   Procedure: INSERTION OF MESH;  Surgeon: Coralie Keens, MD;  Location: [facility];  Service: General;  Laterality: N/A;  . LAPAROSCOPIC INCISIONAL / UMBILICAL / [place]  02/20/2015   incisional w/mesh  . [place] SURGERY  1993  . MOLE REMOVAL  2000's   "off my back; no cancer"  . TONSILLECTOMY  1959  . TUBAL LIGATION  1979   Social History   Socioeconomic History  . Marital status: Married    Spouse name: Not on file  . Number of children: Not on file  . Years of education: Not on file  . Highest education level: Not on file  Occupational History  . Not on file  Social Needs  . Financial resource strain: Not on file  . Food insecurity    Worry: Not on file    Inability: Not on file  . Transportation needs    Medical: Not on file    Non-medical: Not on file  Tobacco Use  . Smoking status: Never Smoker  . Smokeless tobacco: Never Used  Substance and Sexual Activity  . Alcohol use: Yes    Comment: 02/20/2015 "might have a drink q 6 months"  . Drug use: No  . Sexual activity: Not Currently  Lifestyle  . Physical activity    Days per week: Not on file    Minutes per session: Not on file  . Stress: Not on file  Relationships   . Social Herbalist on phone: Not on file    Gets together: Not on file    Attends religious service: Not on file    Active member of club or organization: Not on file    Attends meetings of clubs or organizations: Not on file    Relationship status: Not on file  . Intimate partner violence    Fear of current or ex partner: Not on file    Emotionally abused: Not on file    Physically abused: Not on file    Forced sexual activity: Not on file  Other Topics Concern  . Not on file  Social History Narrative  . Not on file   Current Meds  Medication Sig  . aspirin 81 MG tablet Take 81 mg by mouth daily.  . Biotin 1 MG CAPS Take 1 capsule by mouth daily.  Marland Kitchen CALCIUM PO Take 600 mg by mouth daily.   Marland Kitchen estradiol (ESTRACE) 0.1 MG/GM vaginal cream Place 1 Applicatorful vaginally every other day.  . minoxidil (ROGAINE) 2 % external solution Apply topically 2 (two) times daily.  . Omega-3 Fatty Acids (FISH OIL PO) Take 1,000 mg by mouth 2 (two) times daily.   Family History  Problem Relation Age of Onset  . Colon polyps Father   . Colon cancer Father        ?  . Prostate cancer Father   . Anxiety disorder Mother   . COPD Mother   . Depression Mother   . Esophageal cancer Neg Hx   . Stomach cancer Neg Hx   . Rectal cancer Neg Hx    Allergies  Allergen Reactions  . Penicillins Hives  . Rosuvastatin Calcium Other (See Comments)    Muscle pain, weakness in limbs  . Statins Other (See Comments)    Muscle pain, weakness in limbs     Health Maintenance: Hep C, Mammogram  ROS: Per HPI  Objective: Office vital signs reviewed. BP (!) 158/88   Pulse 100   Temp 98.6 F (37 C) (Temporal)   Ht _0  (1.6 m)   Wt 173 lb (78.5 kg)   BMI 30.65 kg/m   Physical Examination:  General: Awake, alert, well nourished, No acute distress HEENT: Normal, sclera white, MMM Cardio: regular rate and rhythm, S1S2 heard, no murmurs appreciated Pulm: clear to auscultation bilaterally,  no wheezes, rhonchi or rales; normal work of breathing on room air Extremities: warm, well perfused, No edema, cyanosis or clubbing; +2 pulses bilaterally MSK: normal gait and station  Shoulders: No visible deformities, soft tissue swelling or joint swelling noted.  She has full active range of motion but has pain with abduction and internal rotation  of the shoulder.  Wrists: Positive Phalen's and Tinel's on the left.  No gross joint deformities or swelling noted.  She has full active range of motion of bilateral wrists.  Hips: Patient has full active range of motion. Skin: dry; intact; no rashes or lesions Neuro: 5/5 UE and LE Strength and light touch sensation grossly intact.  Assessment/ Plan: 68 y.o. female   1. Chronic pain of both shoulders Uncertain etiology.  Given her greater than 1 year treatment with prednisone I do have concerns for possible autoimmune disease, particularly PMR.  I will check labs to further evaluate.  May need to consider referral back to rheumatology.  I read her 08/19/2014 note which noted that she had a severe allergy to statin drugs.  I will also work on finding the rheumatology records with regards to this issue previously.  Differential diagnosis includes osteoarthritis versus rheumatoid arthritis versus PMR - CK - Sedimentation Rate - Rheumatoid factor - ANA w/Reflex if Positive - CMP14+EGFR - TSH - CBC  2. Bilateral hip pain - CK - Sedimentation Rate - Rheumatoid factor - ANA w/Reflex if Positive - CMP14+EGFR - TSH - CBC  3. Establishing care with new doctor, encounter for  4. Elevated blood pressure reading in office without diagnosis of hypertension Persistent elevation of blood pressure despite normal blood pressures at home.  I would like her to come back in 2 weeks for blood pressure recheck with RN.  If persistently elevated above 150/90, low threshold to initiate Norvasc 5 mg daily.  5. Carpal tunnel syndrome on left Home care  instructions reviewed with patient.  Recommended bracing.  If persistent pain despite conservative therapies plan for referral to orthopedics for consideration of steroid injection.   [name], DO [place] [phone]

## 2019-01-02 LAB — CMP14+EGFR
ALT: 16 IU/L (ref 0–32)
AST: 18 IU/L (ref 0–40)
Albumin/Globulin Ratio: 1.8 (ref 1.2–2.2)
Albumin: 4.2 g/dL (ref 3.8–4.8)
Alkaline Phosphatase: 86 IU/L (ref 39–117)
BUN/Creatinine Ratio: 19 (ref 12–28)
BUN: 16 mg/dL (ref 8–27)
Bilirubin Total: 0.8 mg/dL (ref 0.0–1.2)
CO2: 27 mmol/L (ref 20–29)
Calcium: 9.8 mg/dL (ref 8.7–10.3)
Chloride: 98 mmol/L (ref 96–106)
Creatinine, Ser: 0.84 mg/dL (ref 0.57–1.00)
GFR calc Af Amer: 83 mL/min/{1.73_m2} (ref >59)
GFR calc non Af Amer: 72 mL/min/{1.73_m2} (ref >59)
Globulin, Total: 2.4 g/dL (ref 1.5–4.5)
Glucose: 125 mg/dL — ABNORMAL HIGH (ref 65–99)
Potassium: 4.4 mmol/L (ref 3.5–5.2)
Sodium: 140 mmol/L (ref 134–144)
Total Protein: 6.6 g/dL (ref 6.0–8.5)

## 2019-01-02 LAB — CBC
Hematocrit: 41.4 % (ref 34.0–46.6)
Hemoglobin: 13.9 g/dL (ref 11.1–15.9)
MCH: 30.2 pg (ref 26.6–33.0)
MCHC: 33.6 g/dL (ref 31.5–35.7)
MCV: 90 fL (ref 79–97)
Platelets: 259 10*3/uL (ref 150–450)
RBC: 4.61 x10E6/uL (ref 3.77–5.28)
RDW: 12.5 % (ref 11.7–15.4)
WBC: 5.2 10*3/uL (ref 3.4–10.8)

## 2019-01-02 LAB — SEDIMENTATION RATE: Sed Rate: 32 mm/hr (ref 0–40)

## 2019-01-02 LAB — CK: Total CK: 73 U/L (ref 32–182)

## 2019-01-02 LAB — RHEUMATOID FACTOR: Rheumatoid fact SerPl-aCnc: 12.4 IU/mL (ref 0.0–13.9)

## 2019-01-02 LAB — ANA W/REFLEX IF POSITIVE: Anti Nuclear Antibody (ANA): NEGATIVE

## 2019-01-02 LAB — TSH: TSH: 2.21 u[IU]/mL (ref 0.450–4.500)

## 2019-01-14 ENCOUNTER — Other Ambulatory Visit: Payer: Self-pay

## 2019-01-15 ENCOUNTER — Ambulatory Visit: Payer: Medicare Other | Admitting: *Deleted

## 2019-01-15 DIAGNOSIS — Z0130 Encounter for examination of blood pressure without abnormal findings: Secondary | ICD-10-CM

## 2019-01-15 NOTE — Progress Notes (Signed)
Pt here for BP check BP 144/76 P 91  Rck BP 140/74 P 86

## 2019-01-15 NOTE — Progress Notes (Signed)
BP in acceptable range for age.

## 2019-02-14 ENCOUNTER — Telehealth: Payer: Self-pay | Admitting: Family Medicine

## 2019-02-14 DIAGNOSIS — Z1231 Encounter for screening mammogram for malignant neoplasm of breast: Secondary | ICD-10-CM | POA: Diagnosis not present

## 2019-02-14 NOTE — Telephone Encounter (Signed)
Returned patient's call - Patient was seen 8/11 for bilateral shoulder pain and states it is no better. States that she would like a rx for Meloxicam? Please advise and send to [facility].

## 2019-02-15 ENCOUNTER — Other Ambulatory Visit: Payer: Self-pay

## 2019-02-15 ENCOUNTER — Ambulatory Visit (INDEPENDENT_AMBULATORY_CARE_PROVIDER_SITE_OTHER): Payer: Medicare Other

## 2019-02-15 ENCOUNTER — Encounter: Payer: Self-pay | Admitting: Family Medicine

## 2019-02-15 ENCOUNTER — Ambulatory Visit (INDEPENDENT_AMBULATORY_CARE_PROVIDER_SITE_OTHER): Payer: Medicare Other | Admitting: Family Medicine

## 2019-02-15 VITALS — BP 165/87 | HR 93 | Temp 98.9°F | Ht 63.0 in | Wt 178.0 lb

## 2019-02-15 DIAGNOSIS — M25511 Pain in right shoulder: Secondary | ICD-10-CM

## 2019-02-15 DIAGNOSIS — M25512 Pain in left shoulder: Secondary | ICD-10-CM | POA: Diagnosis not present

## 2019-02-15 DIAGNOSIS — Z23 Encounter for immunization: Secondary | ICD-10-CM

## 2019-02-15 DIAGNOSIS — G8929 Other chronic pain: Secondary | ICD-10-CM

## 2019-02-15 DIAGNOSIS — I10 Essential (primary) hypertension: Secondary | ICD-10-CM

## 2019-02-15 MED ORDER — MELOXICAM 7.5 MG PO TABS: 7.5000 mg | ORAL_TABLET | Freq: Every day | ORAL | 0 refills | Status: DC

## 2019-02-15 MED ORDER — AMLODIPINE BESYLATE 5 MG PO TABS: 5.0000 mg | ORAL_TABLET | Freq: Every day | ORAL | 1 refills | Status: DC

## 2019-02-15 NOTE — Telephone Encounter (Signed)
Please put her on the schedule for appt today. Televisit ok.

## 2019-02-15 NOTE — Progress Notes (Signed)
Subjective: [id number] care, arthralgias HPI: Maria Briggs is a 68 y.o. female presenting to clinic today for:  1.  Arthralgia Patient reports that hip pain seems to be getting better.  Her shoulder pain continues and again is most prevalent in the early morning hours.  Symptoms have been ongoing since the beginning of the year and happened very similarly about 10 years ago, requiring prednisone for about a year by Dr. Amil Amen.  She has been using Tylenol and oral NSAIDs OTC with some improvement but no resolution.  Pain again is worse with internal rotation of the shoulder i.e. unhooking her bra.  She also has some pain with raising her arms above her head.  Denies any sensation changes.  No weakness in the arms.  No preceding injury.  Today pain is 4/10 but this morning it was 8/10.  2.  Elevated blood pressure reading Patient followed up in office for elevated blood pressure reading and noted to have repeat of around 0000000 systolics over [id number] diastolic.  She does not monitor her blood pressure at home.  No chest pain or shortness of breath.   Past Medical History:  Diagnosis Date  . Arthritis    "maybe a little; comes & goes; various parts of my body" (02/20/2015)  . Hyperlipidemia   . PONV (postoperative nausea and vomiting)    Past Surgical History:  Procedure Laterality Date  . BACK SURGERY    . BLADDER SUSPENSION  ~ 2005  . CHOLECYSTECTOMY  1973  . COLONOSCOPY    . COLONOSCOPY W/ BIOPSIES AND POLYPECTOMY    . ENDOMETRIAL ABLATION  2006?  Marland Kitchen HERNIA REPAIR    . INCISIONAL HERNIA REPAIR N/A 02/20/2015   Procedure: LAPAROSCOPIC INCISIONAL HERNIA REPAIR WITH MESH;  Surgeon: Coralie Keens, MD;  Location: [facility];  Service: General;  Laterality: N/A;  . INSERTION OF MESH N/A 02/20/2015   Procedure: INSERTION OF MESH;  Surgeon: Coralie Keens, MD;  Location: [facility];  Service: General;  Laterality: N/A;  . LAPAROSCOPIC INCISIONAL / UMBILICAL / [place]  02/20/2015    incisional w/mesh  . [place] SURGERY  1993  . MOLE REMOVAL  2000's   "off my back; no cancer"  . TONSILLECTOMY  1959  . TUBAL LIGATION  1979   Social History   Socioeconomic History  . Marital status: Married    Spouse name: Not on file  . Number of children: Not on file  . Years of education: Not on file  . Highest education level: Not on file  Occupational History  . Not on file  Social Needs  . Financial resource strain: Not on file  . Food insecurity    Worry: Not on file    Inability: Not on file  . Transportation needs    Medical: Not on file    Non-medical: Not on file  Tobacco Use  . Smoking status: Never Smoker  . Smokeless tobacco: Never Used  Substance and Sexual Activity  . Alcohol use: Yes    Comment: 02/20/2015 "might have a drink q 6 months"  . Drug use: No  . Sexual activity: Not Currently  Lifestyle  . Physical activity    Days per week: Not on file    Minutes per session: Not on file  . Stress: Not on file  Relationships  . Social Herbalist on phone: Not on file    Gets together: Not on file    Attends religious service: Not on  file    Active member of club or organization: Not on file    Attends meetings of clubs or organizations: Not on file    Relationship status: Not on file  . Intimate partner violence    Fear of current or ex partner: Not on file    Emotionally abused: Not on file    Physically abused: Not on file    Forced sexual activity: Not on file  Other Topics Concern  . Not on file  Social History Narrative  . Not on file   Current Meds  Medication Sig  . aspirin 81 MG tablet Take 81 mg by mouth daily.  . Biotin 1 MG CAPS Take 1 capsule by mouth daily.  Marland Kitchen CALCIUM PO Take 600 mg by mouth daily.   . colesevelam (WELCHOL) 625 MG tablet Take 1,875 mg by mouth 2 (two) times daily with a meal.  . estradiol (ESTRACE) 0.1 MG/GM vaginal cream Place 1 Applicatorful vaginally every other day.  . minoxidil (ROGAINE) 2 %  external solution Apply topically 2 (two) times daily.  . Omega-3 Fatty Acids (FISH OIL PO) Take 1,000 mg by mouth 2 (two) times daily.   Family History  Problem Relation Age of Onset  . Colon polyps Father   . Colon cancer Father        ?  . Prostate cancer Father   . Dementia Father   . COPD Father   . Anxiety disorder Mother   . COPD Mother   . Depression Mother   . Heart attack Brother 40  . Mood Disorder Son   . Stroke Paternal Grandmother   . Diabetes Paternal Grandmother   . Stroke Paternal Grandfather   . Esophageal cancer Neg Hx   . Stomach cancer Neg Hx   . Rectal cancer Neg Hx    Allergies  Allergen Reactions  . Penicillins Hives  . Rosuvastatin Calcium Other (See Comments)    Muscle pain, weakness in limbs  . Statins Other (See Comments)    Muscle pain, weakness in limbs     Health Maintenance: Hep C, Mammogram  ROS: Per HPI  Objective: Office vital signs reviewed. BP (!) 165/87   Pulse 93   Temp 98.9 F (37.2 C) (Temporal)   Ht 5\' 3"  (1.6 m)   Wt 178 lb (80.7 kg)   BMI 31.53 kg/m   Physical Examination:  General: Awake, alert, well nourished, No acute distress HEENT: Normal, sclera white, MMM Extremities: warm, well perfused, No edema, cyanosis or clubbing; +2 pulses bilaterally MSK: normal gait and station  Shoulders: Has reduction and internal rotation of bilateral shoulders.  She has painful arc sign but is able to raise her arms above her head.  She has pain with full and empty can testing. Skin: dry; intact; no rashes or lesions Neuro: 5/5 UE and LE Strength and light touch sensation grossly intact.  Assessment/ Plan: 68 y.o. female   1. Chronic pain of both shoulders Uncertain etiology.  Her autoimmune work-up was negative for evidence of rheumatoid arthritis or muscle breakdown.  I have asked her to fill out a release of information form to obtain records from Dr. Amil Amen from her work-up/treatment several years ago.  In the meantime, I  have asked her to start topical Voltaren gel applied to bilateral shoulders up to 4 times daily if needed for pain.  Will obtain imaging to further evaluate.  I have given her a prescription of meloxicam to have on hand if she has  no significant improvement with topical Voltaren.  Advised to avoid other NSAIDs but okay to use Tylenol if needed.  I did offer referral to orthopedics but she would like to hold off on this.  Plan to recheck her in about 4 weeks. - meloxicam (MOBIC) 7.5 MG tablet; Take 1 tablet (7.5 mg total) by mouth daily.  Dispense: 30 tablet; Refill: 0 - DG Shoulder Left; Future - DG Shoulder Right; Future  2. Essential hypertension She has persistent elevation in blood pressures.  This most certainly could be a level of whitecoat syndrome but she was technically still hypertensive with her nursing visit at the end of August.  We will start low-dose amlodipine.  I would like her to start monitoring blood pressures at home and recording.  We will follow-up in 4 weeks. - amLODipine (NORVASC) 5 MG tablet; Take 1 tablet (5 mg total) by mouth daily.  Dispense: 30 tablet; Refill: [place], [place] [phone]

## 2019-02-15 NOTE — Patient Instructions (Signed)
I have sent in meloxicam (mobic) but hold off on picking it up.  I would like you first to try Voltaren gel applied to the shoulders up to 4 times daily if needed for pain.    If you do not see a significant improvement in pain over the next 2 weeks, you can instead use the meloxicam I sent into the pharmacy today.  Okay to continue Tylenol.  We can consider having you see an orthopedist at some point if your symptoms do not improve significantly.  You have prescribed a nonsteroidal anti-inflammatory drug (NSAID) today. This will help with your pain and inflammation. Please do not take any other NSAIDs (ibuprofen/Motrin/Advil, naproxen/Aleve, meloxicam/Mobic, Voltaren/diclofenac). Please make sure to eat a meal when taking this medication.   Caution:  If you have a history of acid reflux/indigestion, I recommend that you take an antacid (such as Prilosec, Prevacid) daily while on the NSAID.  If you have a history of bleeding disorder, gastric ulcer, are on a blood thinner (like warfarin/Coumadin, Xarelto, Eliquis, etc) please do not take NSAID.  If you have ever had a heart attack, you should not take NSAIDs.

## 2019-02-15 NOTE — Telephone Encounter (Signed)
Appt made.  Patient aware  

## 2019-02-19 ENCOUNTER — Ambulatory Visit: Payer: Medicare Other

## 2019-03-09 ENCOUNTER — Other Ambulatory Visit: Payer: Self-pay | Admitting: Family Medicine

## 2019-03-09 DIAGNOSIS — G8929 Other chronic pain: Secondary | ICD-10-CM

## 2019-03-09 DIAGNOSIS — M25511 Pain in right shoulder: Secondary | ICD-10-CM

## 2019-03-21 ENCOUNTER — Other Ambulatory Visit: Payer: Self-pay

## 2019-03-22 ENCOUNTER — Encounter: Payer: Self-pay | Admitting: Family Medicine

## 2019-03-22 ENCOUNTER — Ambulatory Visit (INDEPENDENT_AMBULATORY_CARE_PROVIDER_SITE_OTHER): Payer: Medicare Other | Admitting: Family Medicine

## 2019-03-22 VITALS — BP 136/80 | HR 81 | Temp 98.7°F | Ht 63.0 in | Wt 178.0 lb

## 2019-03-22 DIAGNOSIS — M25511 Pain in right shoulder: Secondary | ICD-10-CM | POA: Diagnosis not present

## 2019-03-22 DIAGNOSIS — M792 Neuralgia and neuritis, unspecified: Secondary | ICD-10-CM

## 2019-03-22 DIAGNOSIS — M25512 Pain in left shoulder: Secondary | ICD-10-CM

## 2019-03-22 DIAGNOSIS — G8929 Other chronic pain: Secondary | ICD-10-CM

## 2019-03-22 DIAGNOSIS — I10 Essential (primary) hypertension: Secondary | ICD-10-CM

## 2019-03-22 MED ORDER — AMLODIPINE BESYLATE 5 MG PO TABS: 5.0000 mg | ORAL_TABLET | Freq: Every day | ORAL | 3 refills | Status: DC

## 2019-03-22 MED ORDER — MELOXICAM 15 MG PO TABS: 7.5000 mg | ORAL_TABLET | Freq: Every day | ORAL | 1 refills | Status: DC

## 2019-03-22 NOTE — Progress Notes (Signed)
Subjective: CC: Hypertension PCP: [name], DO [id number] Maria Briggs is a 68 y.o. female presenting to clinic today for:  1.  Hypertension Diagnosed at last visit and started on Norvasc 5 mg daily.  She is been tolerating the medication without difficulty.  No chest pain, shortness of breath, dizziness.  No lower extremity edema.  She is been monitoring her blood pressures very closely and blood pressures at home have been ranging from [id number] systolic with diastolics typically around the 70s to low 80s.  2.  Bilateral shoulder pain Patient with ongoing bilateral shoulder pain with left greater than right.  She notes pain in the left shoulder radiating down to the left arm particularly at nighttime.  She is unable to roll onto that side nor lay flat on her back at bedtime.  She reports electric-like sensation down the left upper extremity.  She also reports weakness in her hands and upper extremities.  She does have a history of degenerative back changes which required surgery in the past at [facility].  This was in 1993.  She questions if she started to have neck pathology given symptoms.  She does note that the meloxicam helped with bilateral knee pain but has not helped at all with the shoulder pain.  Symptoms are also refractory to topical Voltaren gel.    ROS: Per HPI  Allergies  Allergen Reactions  . Penicillins Hives  . Rosuvastatin Calcium Other (See Comments)    Muscle pain, weakness in limbs  . Statins Other (See Comments)    Muscle pain, weakness in limbs   Past Medical History:  Diagnosis Date  . Arthritis    "maybe a little; comes & goes; various parts of my body" (02/20/2015)  . Hyperlipidemia   . PONV (postoperative nausea and vomiting)     Current Outpatient Medications:  .  amLODipine (NORVASC) 5 MG tablet, Take 1 tablet (5 mg total) by mouth daily., Disp: 30 tablet, Rfl: 1 .  aspirin 81 MG tablet, Take 81 mg by mouth daily., Disp: , Rfl:  .  Biotin 1  MG CAPS, Take 1 capsule by mouth daily., Disp: , Rfl:  .  CALCIUM PO, Take 600 mg by mouth daily. , Disp: , Rfl:  .  colesevelam (WELCHOL) 625 MG tablet, Take 1,875 mg by mouth 2 (two) times daily with a meal., Disp: , Rfl:  .  estradiol (ESTRACE) 0.1 MG/GM vaginal cream, Place 1 Applicatorful vaginally every other day., Disp: , Rfl:  .  meloxicam (MOBIC) 7.5 MG tablet, TAKE 1 TABLET BY MOUTH EVERY DAY, Disp: 30 tablet, Rfl: 0 .  minoxidil (ROGAINE) 2 % external solution, Apply topically 2 (two) times daily., Disp: , Rfl:  .  Omega-3 Fatty Acids (FISH OIL PO), Take 1,000 mg by mouth 2 (two) times daily., Disp: , Rfl:  Social History   Socioeconomic History  . Marital status: Married    Spouse name: Not on file  . Number of children: Not on file  . Years of education: Not on file  . Highest education level: Not on file  Occupational History  . Not on file  Social Needs  . Financial resource strain: Not on file  . Food insecurity    Worry: Not on file    Inability: Not on file  . Transportation needs    Medical: Not on file    Non-medical: Not on file  Tobacco Use  . Smoking status: Never Smoker  . Smokeless tobacco: Never Used  Substance and Sexual Activity  . Alcohol use: Yes    Comment: 02/20/2015 "might have a drink q 6 months"  . Drug use: No  . Sexual activity: Not Currently  Lifestyle  . Physical activity    Days per week: Not on file    Minutes per session: Not on file  . Stress: Not on file  Relationships  . Social Herbalist on phone: Not on file    Gets together: Not on file    Attends religious service: Not on file    Active member of club or organization: Not on file    Attends meetings of clubs or organizations: Not on file    Relationship status: Not on file  . Intimate partner violence    Fear of current or ex partner: Not on file    Emotionally abused: Not on file    Physically abused: Not on file    Forced sexual activity: Not on file  Other  Topics Concern  . Not on file  Social History Narrative  . Not on file   Family History  Problem Relation Age of Onset  . Colon polyps Father   . Colon cancer Father        ?  . Prostate cancer Father   . Dementia Father   . COPD Father   . Anxiety disorder Mother   . COPD Mother   . Depression Mother   . Heart attack Brother 70  . Mood Disorder Son   . Stroke Paternal Grandmother   . Diabetes Paternal Grandmother   . Stroke Paternal Grandfather   . Esophageal cancer Neg Hx   . Stomach cancer Neg Hx   . Rectal cancer Neg Hx     Objective: Office vital signs reviewed. BP 136/80   Pulse 81   Temp 98.7 F (37.1 C) (Temporal)   Ht 5\' 3"  (1.6 m)   Wt 178 lb (80.7 kg)   SpO2 98%   BMI 31.53 kg/m   Physical Examination:  General: Awake, alert, well nourished, No acute distress HEENT: Normal Cardio: regular rate and rhythm, S1S2 heard, no murmurs appreciated Pulm: clear to auscultation bilaterally, no wheezes, rhonchi or rales; normal work of breathing on room air Extremities: warm, well perfused, No edema, cyanosis or clubbing; +2 pulses bilaterally MSK:   C-spine: Has full active range of motion in extension and flexion.  She has pain with rotation to the left and right.  She has no midline tenderness to palpation but does have left-sided paraspinal muscle tenderness to palpation, radiating into the left trapezius.  Negative Spurling's  Upper extremities: Reduced active range of motion in abduction and internal rotation, limited by pain.  Handgrip is appropriate bilaterally. Skin: dry; intact; no rashes or lesions Neuro: Light touch sensation grossly intact  Assessment/ Plan: 68 y.o. female   1. Essential hypertension Controlled.  Continue Norvasc. - amLODipine (NORVASC) 5 MG tablet; Take 1 tablet (5 mg total) by mouth daily.  Dispense: 90 tablet; Refill: 3  2. Chronic pain of both shoulders I question degenerative changes but also worried that she has degenerative  changes within the C-spine as well.  I have renewed the meloxicam since this has been helpful and increase the dose to 15 mg.  Also placed a referral to orthopedics for further evaluation management.  Patient wished to [facility] to [facility]. - meloxicam (MOBIC) 15 MG tablet; Take 0.5-1 tablets (7.5-15 mg total) by mouth daily.  Dispense: 30 tablet;  Refill: 1 - Ambulatory referral to Orthopedic Surgery  3. Radicular pain of left upper extremity Concerning for C-spine pathology.  Defer imaging studies to orthopedics - Ambulatory referral to Orthopedic Surgery   No orders of the defined types were placed in this encounter.  No orders of the defined types were placed in this encounter.    [name], DO [place] [phone]

## 2019-04-01 DIAGNOSIS — M25512 Pain in left shoulder: Secondary | ICD-10-CM | POA: Diagnosis not present

## 2019-04-01 DIAGNOSIS — M25511 Pain in right shoulder: Secondary | ICD-10-CM | POA: Diagnosis not present

## 2019-04-08 ENCOUNTER — Other Ambulatory Visit: Payer: Self-pay

## 2019-04-08 ENCOUNTER — Ambulatory Visit: Payer: Medicare Other | Attending: Orthopedic Surgery | Admitting: Physical Therapy

## 2019-04-08 ENCOUNTER — Encounter: Payer: Self-pay | Admitting: Physical Therapy

## 2019-04-08 DIAGNOSIS — M25512 Pain in left shoulder: Secondary | ICD-10-CM | POA: Diagnosis not present

## 2019-04-08 DIAGNOSIS — G8929 Other chronic pain: Secondary | ICD-10-CM | POA: Diagnosis not present

## 2019-04-08 DIAGNOSIS — M6281 Muscle weakness (generalized): Secondary | ICD-10-CM | POA: Insufficient documentation

## 2019-04-08 DIAGNOSIS — G5602 Carpal tunnel syndrome, left upper limb: Secondary | ICD-10-CM

## 2019-04-08 NOTE — Therapy (Signed)
[facility] [place], [place], [address] Phone: [phone]   Fax:  [phone]  Physical Therapy Evaluation  Patient Details  Name: Maria Briggs MRN: [id number] Date of Birth: Aug 02, 1950 Referring Provider (PT): Earlie Server MD   Encounter Date: 04/08/2019  PT End of Session - 04/08/19 1226    Visit Number  1    Number of Visits  1    Date for PT Re-Evaluation  04/08/19    PT Start Time  1025    PT Stop Time  1055    PT Time Calculation (min)  30 min    Activity Tolerance  Patient tolerated treatment well    Behavior During Therapy  [facility] for tasks assessed/performed       Past Medical History:  Diagnosis Date  . Arthritis    "maybe a little; comes & goes; various parts of my body" (02/20/2015)  . Hyperlipidemia   . PONV (postoperative nausea and vomiting)     Past Surgical History:  Procedure Laterality Date  . BACK SURGERY    . BLADDER SUSPENSION  ~ 2005  . CHOLECYSTECTOMY  1973  . COLONOSCOPY    . COLONOSCOPY W/ BIOPSIES AND POLYPECTOMY    . ENDOMETRIAL ABLATION  2006?  Marland Kitchen HERNIA REPAIR    . INCISIONAL HERNIA REPAIR N/A 02/20/2015   Procedure: LAPAROSCOPIC INCISIONAL HERNIA REPAIR WITH MESH;  Surgeon: Coralie Keens, MD;  Location: [facility];  Service: General;  Laterality: N/A;  . INSERTION OF MESH N/A 02/20/2015   Procedure: INSERTION OF MESH;  Surgeon: Coralie Keens, MD;  Location: [facility];  Service: General;  Laterality: N/A;  . LAPAROSCOPIC INCISIONAL / UMBILICAL / [place]  02/20/2015   incisional w/mesh  . [place] SURGERY  1993  . MOLE REMOVAL  2000's   "off my back; no cancer"  . TONSILLECTOMY  1959  . TUBAL LIGATION  1979    There were no vitals filed for this visit.   Subjective Assessment - 04/08/19 1244    Subjective  COVID-19 screen performed prior to patient entering clinic.  The patient presents to the clinic today with c/o left shoulder pain that hs been ongoing for  abou 6 months.  Her pain-level is a low 2/10 today and she contributes this to a recent injection she received.  She does report, however, that certain movements (ie:  pushing) increase her pain and she can no longer sleep prone due to shoulder pain.  She is to be evaluated and establish a HEP today.  She states she is going to have further testing including a NCV.  She has also worn a cock-up splint for potential CTS but could not tolerate it.  She also experiences pain to her lefy=t hand that feeling like a burning sensation.    Pertinent History  Back surgery, arhritis, hernia repair.    Patient Stated Goals  Get out of pain.    Currently in Pain?  Yes    Pain Score  2     Pain Location  Shoulder    Pain Orientation  Left    Pain Descriptors / Indicators  Aching    Pain Type  Chronic pain    Pain Radiating Towards  Left UE.    Pain Onset  More than a month ago    Pain Frequency  Constant    Aggravating Factors   Certain movements and pushing wiht left UE.    Pain Relieving Factors  Rest.  [facility] PT Assessment - 04/08/19 0001      Assessment   Medical Diagnosis  Left shoulder pain.    Referring Provider (PT)  Earlie Server MD    Onset Date/Surgical Date  --   ~6 months.     Precautions   Precautions  None      Restrictions   Weight Bearing Restrictions  No      Balance Screen   Has the patient fallen in the past 6 months  No    Has the patient had a decrease in activity level because of a fear of falling?   No    Is the patient reluctant to leave their home because of a fear of falling?   No      Home Environment   Living Environment  Private residence      Prior Function   Level of [facility]  [facility]      Posture/Postural Control   Posture/Postural Control  Postural limitations    Postural Limitations  Rounded Shoulders;Forward head      Deep Tendon Reflexes   DTR Assessment Site  Biceps;Brachioradialis;Triceps    Biceps DTR  2+    Brachioradialis  DTR  2+    Triceps DTR  2+      ROM / Strength   AROM / PROM / Strength  AROM;Strength      AROM   Overall AROM Comments  Left shoulder flexion= 135 degrees, ER is full and IR to lower thoracic.      Strength   Overall Strength Comments  Left shoulder flexion= 4/5, abduction= 4-/5, ER= 4-/5 and IR= 4+/5.      Palpation   Palpation comment  No reported left shoulder pain.  CC is referred pain into left UE.      Special Tests   Other special tests  (-) left Drop Arm test.  Mildly positive left shoulder pain wiht an Impingement test.      Ambulation/Gait   Gait Comments  WNL.                Objective measurements completed on examination: See above findings.              PT Education - 04/08/19 1252    Education Details  Wall climb.  [id number].  Yellow theraband provided.          PT Long Term Goals - 04/08/19 1407      PT LONG TERM GOAL #1   Title  Eval only.             Plan - 04/08/19 1402    Clinical Impression Statement  The patient presents to [facility] with c/o ongoing and worsening left shoulder pain over the last 6 months.  A recent injection helped but she continues to experience symptoms into her left UE to her hand.  She states she is to undergo further testing (ie:  NCV).  She had a limitation of left shoulder flexion but other motions were essentially WF/NL.  She also has weakness in her RTC and deltoid musculature.  She had no palpable pain.  Patient to clinic today for one visit for evaluation and establishment of a HEP.    Personal Factors and Comorbidities  Comorbidity 1    Comorbidities  Back surgery, arhritis, hernia repair.    Examination-Activity Limitations  Sleep;Reach Overhead    Stability/Clinical Decision Making  Evolving/Moderate complexity    Clinical Decision Making  Moderate    Rehab Potential  Good  PT Frequency  1x / week    Consulted and Agree with Plan of Care  Patient       Patient will benefit from skilled  therapeutic intervention in order to improve the following deficits and impairments:  Pain, Postural dysfunction, Decreased range of motion, Decreased strength  Visit Diagnosis: Chronic left shoulder pain - Plan: PT plan of care cert/re-cert  Muscle weakness (generalized) - Plan: PT plan of care cert/re-cert     Problem List Patient Active Problem List   Diagnosis Date Noted  . Essential hypertension 02/15/2019  . Elevated blood pressure reading in office without diagnosis of hypertension 01/01/2019  . Bilateral hip pain 01/01/2019  . Chronic pain of both shoulders 01/01/2019  . Carpal tunnel syndrome on left 01/01/2019  . History of recurrent UTIs 09/29/2016  . Acute cystitis without hematuria 03/14/2016  . Post-menopause atrophic vaginitis 03/14/2016  . Painful urination 02/11/2016  . Urinary tract infection with hematuria 02/11/2016  . Incisional hernia 02/20/2015    Terri Malerba, [place] MPT 04/08/2019, 2:10 PM  [facility] [address] [place], [place], [address] Phone: [phone]   Fax:  [phone]  Name: Maria Briggs MRN: [id number] Date of Birth: 29-Jul-1950

## 2019-04-09 DIAGNOSIS — M19011 Primary osteoarthritis, right shoulder: Secondary | ICD-10-CM | POA: Diagnosis not present

## 2019-04-09 DIAGNOSIS — M25512 Pain in left shoulder: Secondary | ICD-10-CM | POA: Diagnosis not present

## 2019-04-15 ENCOUNTER — Other Ambulatory Visit: Payer: Self-pay | Admitting: *Deleted

## 2019-04-15 MED ORDER — COLESEVELAM HCL 625 MG PO TABS: 1875.0000 mg | ORAL_TABLET | Freq: Two times a day (BID) | ORAL | 1 refills | Status: DC

## 2019-04-15 NOTE — Telephone Encounter (Signed)
Had lipid with other MD in January.  Please make sure she is scheduled for fasting labs sometime in jan.

## 2019-05-01 ENCOUNTER — Other Ambulatory Visit: Payer: Self-pay

## 2019-05-02 ENCOUNTER — Other Ambulatory Visit: Payer: Self-pay

## 2019-05-02 ENCOUNTER — Ambulatory Visit (INDEPENDENT_AMBULATORY_CARE_PROVIDER_SITE_OTHER): Payer: Medicare Other | Admitting: Neurology

## 2019-05-02 DIAGNOSIS — G5602 Carpal tunnel syndrome, left upper limb: Secondary | ICD-10-CM

## 2019-05-02 NOTE — Procedures (Signed)
[facility]  [address], [place]  [place], [place] [address] Tel: [phone] Fax:  [phone] Test Date:  05/02/2019  Patient: Maria Briggs DOB: 06-13-1950 Physician: Narda Amber, DO  Sex: Female Height: 5\' 3"  Ref Phys: Faythe Casa, MD  ID#: [id number] Temp: 32.0C Technician:    Patient Complaints: This is a 68 year old female referred for evaluation of the left hand paresthesias.  NCV & EMG Findings: Extensive electrodiagnostic testing of the left upper extremity shows:  1. Left median sensory response shows prolonged latency (5.2 ms).  Left ulnar sensory responses within normal limits.   2. Left median motor response shows prolonged latency (5.5 ms).  Of note, there is evidence of a left median-to-ulnar crossover in the forearm as seen by a motor response stimulating at the ulnar-wrist and recording at the abductor pollicis brevis muscle.  The left ulnar motor response is within normal limits.  3. There is no evidence of active or chronic motor axonal loss changes affecting any of the tested muscles.  Motor unit configuration and recruitment pattern is within normal limits.    Impression: 1. 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   2. Incidentally, there is a left Martin-Gruber anastomosis, a normal anatomic variant.   ___________________________ Narda Amber, DO    Nerve Conduction Studies Anti Sensory Summary Table   Site NR Peak (ms) Norm Peak (ms) P-T Amp (V) Norm P-T Amp  Left Median Anti Sensory (2nd Digit)  32C  Wrist    5.2 <3.8 16.9 >10  Left Ulnar Anti Sensory (5th Digit)  32C  Wrist    2.6 <3.2 27.7 >5   Motor Summary Table   Site NR Onset (ms) Norm Onset (ms) O-P Amp (mV) Norm O-P Amp Site1 Site2 Delta-0 (ms) Dist (cm) Vel (m/s) Norm Vel (m/s)  Left Median Motor (Abd Poll Brev)  32C  Wrist    5.5 <4.0 6.0 >5 Elbow Wrist 3.3 25.0 76 >50   Elbow    8.8  6.1  Ulnar-wrist crossover Elbow 4.9 0.0    Ulnar-wrist crossover    3.9  3.4         Left Ulnar Motor (Abd Dig Minimi)  32C  Wrist    2.3 <3.1 7.9 >7 B Elbow Wrist 3.6 22.0 61 >50  B Elbow    5.9  7.1  A Elbow B Elbow 1.7 10.0 59 >50  A Elbow    7.6  6.6          EMG   Side Muscle Ins Act Fibs Psw Fasc Number Recrt Dur Dur. Amp Amp. Poly Poly. Comment  Left 1stDorInt Nml Nml Nml Nml Nml Nml Nml Nml Nml Nml Nml Nml N/A  Left PronatorTeres Nml Nml Nml Nml Nml Nml Nml Nml Nml Nml Nml Nml N/A  Left Biceps Nml Nml Nml Nml Nml Nml Nml Nml Nml Nml Nml Nml N/A  Left Triceps Nml Nml Nml Nml Nml Nml Nml Nml Nml Nml Nml Nml N/A  Left Deltoid Nml Nml Nml Nml Nml Nml Nml Nml Nml Nml Nml Nml N/A  Left Abd Poll Brev Nml Nml Nml Nml Nml Nml Nml Nml Nml Nml Nml Nml N/A      Waveforms:

## 2019-05-06 DIAGNOSIS — M25512 Pain in left shoulder: Secondary | ICD-10-CM | POA: Diagnosis not present

## 2019-05-06 DIAGNOSIS — M25511 Pain in right shoulder: Secondary | ICD-10-CM | POA: Diagnosis not present

## 2019-05-22 ENCOUNTER — Other Ambulatory Visit: Payer: Self-pay | Admitting: Family Medicine

## 2019-05-22 DIAGNOSIS — G8929 Other chronic pain: Secondary | ICD-10-CM

## 2019-05-22 DIAGNOSIS — M25512 Pain in left shoulder: Secondary | ICD-10-CM

## 2019-06-12 ENCOUNTER — Ambulatory Visit: Payer: Medicare Other | Attending: Internal Medicine

## 2019-06-12 DIAGNOSIS — Z23 Encounter for immunization: Secondary | ICD-10-CM | POA: Insufficient documentation

## 2019-06-12 NOTE — Progress Notes (Signed)
   Covid-19 Vaccination Clinic  Name:  Maria Briggs    MRN: [id number] DOB: August 06, 1950  06/12/2019  [name] was observed post Covid-19 immunization for 15 minutes without incidence. She was provided with Vaccine Information Sheet and instruction to access the V-Safe system.   [name] was instructed to call 911 with any severe reactions post vaccine: Marland Kitchen Difficulty breathing  . Swelling of your face and throat  . A fast heartbeat  . A bad rash all over your body  . Dizziness and weakness    Immunizations Administered    Name Date Dose VIS Date Route   Pfizer COVID-19 Vaccine 06/12/2019  2:30 PM 0.3 mL 05/03/2019 Intramuscular   Manufacturer: [place]   Lot: [id number]   [place]: [id number]

## 2019-07-03 ENCOUNTER — Ambulatory Visit: Payer: Medicare Other | Attending: Internal Medicine

## 2019-07-03 DIAGNOSIS — Z23 Encounter for immunization: Secondary | ICD-10-CM

## 2019-07-03 NOTE — Progress Notes (Signed)
   Covid-19 Vaccination Clinic  Name:  Maria Briggs    MRN: [id number] DOB: [birth year]  07/03/2019  [name] was observed post Covid-19 immunization for 15 minutes without incidence. She was provided with Vaccine Information Sheet and instruction to access the V-Safe system.   [name] was instructed to call 911 with any severe reactions post vaccine: Marland Kitchen Difficulty breathing  . Swelling of your face and throat  . A fast heartbeat  . A bad rash all over your body  . Dizziness and weakness    Immunizations Administered    Name Date Dose VIS Date Route   Pfizer COVID-19 Vaccine 07/03/2019  8:22 AM 0.3 mL 05/03/2019 Intramuscular   Manufacturer: [place]   Lot: [id number]   [place]: [id number]

## 2019-07-07 ENCOUNTER — Other Ambulatory Visit: Payer: Self-pay | Admitting: Family Medicine

## 2019-07-07 DIAGNOSIS — G8929 Other chronic pain: Secondary | ICD-10-CM

## 2019-08-07 DIAGNOSIS — B078 Other viral warts: Secondary | ICD-10-CM | POA: Diagnosis not present

## 2019-08-07 DIAGNOSIS — D225 Melanocytic nevi of trunk: Secondary | ICD-10-CM | POA: Diagnosis not present

## 2019-08-07 DIAGNOSIS — Z1283 Encounter for screening for malignant neoplasm of skin: Secondary | ICD-10-CM | POA: Diagnosis not present

## 2019-08-27 DIAGNOSIS — Z124 Encounter for screening for malignant neoplasm of cervix: Secondary | ICD-10-CM | POA: Diagnosis not present

## 2020-02-11 DIAGNOSIS — Z87440 Personal history of urinary (tract) infections: Secondary | ICD-10-CM | POA: Diagnosis not present

## 2020-03-16 ENCOUNTER — Ambulatory Visit (INDEPENDENT_AMBULATORY_CARE_PROVIDER_SITE_OTHER): Payer: Medicare Other | Admitting: Family Medicine

## 2020-03-16 ENCOUNTER — Encounter: Payer: Self-pay | Admitting: Family Medicine

## 2020-03-16 ENCOUNTER — Other Ambulatory Visit: Payer: Self-pay

## 2020-03-16 VITALS — BP 149/81 | HR 82 | Temp 98.0°F | Ht 63.0 in | Wt 181.4 lb

## 2020-03-16 DIAGNOSIS — M25511 Pain in right shoulder: Secondary | ICD-10-CM | POA: Diagnosis not present

## 2020-03-16 DIAGNOSIS — M25512 Pain in left shoulder: Secondary | ICD-10-CM

## 2020-03-16 DIAGNOSIS — Z23 Encounter for immunization: Secondary | ICD-10-CM | POA: Diagnosis not present

## 2020-03-16 DIAGNOSIS — R739 Hyperglycemia, unspecified: Secondary | ICD-10-CM | POA: Diagnosis not present

## 2020-03-16 DIAGNOSIS — I10 Essential (primary) hypertension: Secondary | ICD-10-CM | POA: Diagnosis not present

## 2020-03-16 DIAGNOSIS — E7800 Pure hypercholesterolemia, unspecified: Secondary | ICD-10-CM

## 2020-03-16 DIAGNOSIS — G8929 Other chronic pain: Secondary | ICD-10-CM

## 2020-03-16 LAB — BAYER DCA HB A1C WAIVED: HB A1C (BAYER DCA - WAIVED): 5.4 % (ref <7.0)

## 2020-03-16 MED ORDER — AMLODIPINE BESYLATE 5 MG PO TABS: 5.0000 mg | ORAL_TABLET | Freq: Every day | ORAL | 3 refills | Status: DC

## 2020-03-16 MED ORDER — MELOXICAM 15 MG PO TABS: 7.5000 mg | ORAL_TABLET | Freq: Every day | ORAL | 2 refills | Status: DC

## 2020-03-16 NOTE — Patient Instructions (Signed)

## 2020-03-16 NOTE — Progress Notes (Signed)
Subjective: CC: yearly follow up for Hypertension PCP: [name], DO [id number] Maria Briggs is a 69 y.o. female presenting to clinic today for:  1.  Hypertension with hyperlipidemia Patient is compliant with Norvasc 5 mg daily but not take today because she wanted to be fasting for labs.  She has been off of the WelChol for about 4 to 5 months due to cost.  It was over $200.  She has had statin intolerance in the past and does not wish to resume any statins.  No chest pain, shortness of breath, edema, headache or visual disturbance.  2.  Bilateral shoulder pain Patient reports that the shoulder pain which was predominantly on the left has improved quite a bit since changing her mattress.  She did get a corticosteroid injection into the left shoulder which she also felt to be somewhat helpful but really the change in mattress did the trick.  She notes that when she has little bit of an ache she will use the meloxicam 7.5 to 15 mg.  She does not use this frequently.  Does not report any stomach upset.   ROS: Per HPI  Allergies  Allergen Reactions   Penicillins Hives   Rosuvastatin Calcium Other (See Comments)    Muscle pain, weakness in limbs   Statins Other (See Comments)    Muscle pain, weakness in limbs   Past Medical History:  Diagnosis Date   Arthritis    "maybe a little; comes & goes; various parts of my body" (02/20/2015)   Hyperlipidemia    PONV (postoperative nausea and vomiting)     Current Outpatient Medications:    amLODipine (NORVASC) 5 MG tablet, Take 1 tablet (5 mg total) by mouth daily., Disp: 90 tablet, Rfl: 3   aspirin 81 MG tablet, Take 81 mg by mouth daily., Disp: , Rfl:    Biotin 1 MG CAPS, Take 1 capsule by mouth daily., Disp: , Rfl:    CALCIUM PO, Take 600 mg by mouth daily. , Disp: , Rfl:    colesevelam (WELCHOL) 625 MG tablet, Take 3 tablets (1,875 mg total) by mouth 2 (two) times daily with a meal., Disp: 540 tablet, Rfl: 1    estradiol (ESTRACE) 0.1 MG/GM vaginal cream, Place 1 Applicatorful vaginally every other day., Disp: , Rfl:    meloxicam (MOBIC) 15 MG tablet, TAKE 0.5-1 TABLETS (7.5-15 MG TOTAL) BY MOUTH DAILY., Disp: 30 tablet, Rfl: 0   minoxidil (ROGAINE) 2 % external solution, Apply topically 2 (two) times daily., Disp: , Rfl:    Omega-3 Fatty Acids (FISH OIL PO), Take 1,000 mg by mouth 2 (two) times daily., Disp: , Rfl:  Social History   Socioeconomic History   Marital status: Married    Spouse name: Not on file   Number of children: Not on file   Years of education: Not on file   Highest education level: Not on file  Occupational History   Not on file  Tobacco Use   Smoking status: Never Smoker   Smokeless tobacco: Never Used  Vaping Use   Vaping Use: Never used  Substance and Sexual Activity   Alcohol use: Yes    Comment: 02/20/2015 "might have a drink q 6 months"   Drug use: No   Sexual activity: Not Currently  Other Topics Concern   Not on file  Social History Narrative   Not on file   Social Determinants of Health   Financial Resource Strain:    Difficulty of Paying Living  Expenses: Not on file  Food Insecurity:    Worried About Charity fundraiser in the Last Year: Not on file   [employer] of Food in the Last Year: Not on file  Transportation Needs:    Lack of Transportation (Medical): Not on file   Lack of Transportation (Non-Medical): Not on file  Physical Activity:    Days of Exercise per Week: Not on file   Minutes of Exercise per Session: Not on file  Stress:    Feeling of Stress : Not on file  Social Connections:    Frequency of Communication with Friends and Family: Not on file   Frequency of Social Gatherings with Friends and Family: Not on file   Attends Religious Services: Not on file   Active Member of Clubs or Organizations: Not on file   Attends Archivist Meetings: Not on file   Marital Status: Not on file  Intimate  Partner Violence:    Fear of Current or Ex-Partner: Not on file   Emotionally Abused: Not on file   Physically Abused: Not on file   Sexually Abused: Not on file   Family History  Problem Relation Age of Onset   Colon polyps Father    Colon cancer Father        ?   Prostate cancer Father    Dementia Father    COPD Father    Anxiety disorder Mother    COPD Mother    Depression Mother    Heart attack Brother 42   Mood Disorder Son    Stroke Paternal Grandmother    Diabetes Paternal Grandmother    Stroke Paternal Grandfather    Esophageal cancer Neg Hx    Stomach cancer Neg Hx    Rectal cancer Neg Hx     Objective: Office vital signs reviewed. BP (!) 149/81    Pulse 82    Temp 98 F (36.7 C)    Ht $R'5\' 3"'mh$  (1.6 m)    Wt 181 lb 6.4 oz (82.3 kg)    SpO2 96%    BMI 32.13 kg/m   Physical Examination:  General: Awake, alert, well nourished, obese. No acute distress HEENT: Normal, sclera white.  No carotid bruits Cardio: regular rate and rhythm, S1S2 heard, no murmurs appreciated Pulm: clear to auscultation bilaterally, no wheezes, rhonchi or rales; normal work of breathing on room air Extremities: warm, well perfused, No edema, cyanosis or clubbing; +2 pulses bilaterally MSK: She has a full active range of motion of bilateral shoulders.  No gross joint swelling or deformities noted.  Assessment/ Plan: 69 y.o. female   Essential hypertension - Plan: CMP14+EGFR, Lipid panel, amLODipine (NORVASC) 5 MG tablet, Lipid panel, CMP14+EGFR  Elevated serum glucose - Plan: CMP14+EGFR, Bayer DCA Hb A1c Waived, Bayer DCA Hb A1c Waived, CMP14+EGFR  Chronic pain of both shoulders - Plan: meloxicam (MOBIC) 15 MG tablet  Need for immunization against influenza - Plan: Flu Vaccine QUAD High Dose(Fluad)  Pure hypercholesterolemia - Plan: Lipid panel  Blood pressure not at goal but she did not take her blood pressure medication today.  Recommending repeat with nurse in the  next 2 weeks and advised to take blood pressure prior to arrival.  Fasting labs were obtained today.  No evidence of diabetes despite elevated serum glucose on previous checkup.  Meloxicam was renewed for as needed use.  Influenza vaccination administered.  Release of information form for DEXA scan completed today.  Fasting lipid panel obtained.  We will  determine need for ongoing cholesterol medication pending this result.  We discussed alternatives to statins and WelChol, including Zetia and Nexletol.  Orders Placed This Encounter  Procedures   Flu Vaccine QUAD High Dose(Fluad)   CMP14+EGFR    Standing Status:   Future    Number of Occurrences:   1    Standing Expiration Date:   03/16/2021   Lipid panel    Standing Status:   Future    Number of Occurrences:   1    Standing Expiration Date:   03/16/2021   Bayer DCA Hb A1c Waived    Standing Status:   Future    Number of Occurrences:   1    Standing Expiration Date:   03/16/2021   No orders of the defined types were placed in this encounter.    [name], DO [place] [phone]

## 2020-03-17 LAB — CMP14+EGFR
ALT: 12 IU/L (ref 0–32)
AST: 13 IU/L (ref 0–40)
Albumin/Globulin Ratio: 1.8 (ref 1.2–2.2)
Albumin: 4.4 g/dL (ref 3.8–4.8)
Alkaline Phosphatase: 77 IU/L (ref 44–121)
BUN/Creatinine Ratio: 23 (ref 12–28)
BUN: 18 mg/dL (ref 8–27)
Bilirubin Total: 1.1 mg/dL (ref 0.0–1.2)
CO2: 25 mmol/L (ref 20–29)
Calcium: 9.4 mg/dL (ref 8.7–10.3)
Chloride: 103 mmol/L (ref 96–106)
Creatinine, Ser: 0.77 mg/dL (ref 0.57–1.00)
GFR calc Af Amer: 92 mL/min/{1.73_m2} (ref >59)
GFR calc non Af Amer: 80 mL/min/{1.73_m2} (ref >59)
Globulin, Total: 2.4 g/dL (ref 1.5–4.5)
Glucose: 95 mg/dL (ref 65–99)
Potassium: 4.7 mmol/L (ref 3.5–5.2)
Sodium: 141 mmol/L (ref 134–144)
Total Protein: 6.8 g/dL (ref 6.0–8.5)

## 2020-03-17 LAB — LIPID PANEL
Chol/HDL Ratio: 3.2 ratio (ref 0.0–4.4)
Cholesterol, Total: 284 mg/dL — ABNORMAL HIGH (ref 100–199)
HDL: 89 mg/dL (ref >39)
LDL Chol Calc (NIH): 180 mg/dL — ABNORMAL HIGH (ref 0–99)
Triglycerides: 90 mg/dL (ref 0–149)
VLDL Cholesterol Cal: 15 mg/dL (ref 5–40)

## 2020-03-20 ENCOUNTER — Telehealth: Payer: Self-pay

## 2020-03-20 NOTE — Telephone Encounter (Signed)
Refer to lab results.  

## 2020-03-30 ENCOUNTER — Ambulatory Visit: Payer: Medicare Other

## 2020-03-30 ENCOUNTER — Other Ambulatory Visit: Payer: Self-pay

## 2020-03-30 DIAGNOSIS — Z0130 Encounter for examination of blood pressure without abnormal findings: Secondary | ICD-10-CM

## 2020-03-30 NOTE — Progress Notes (Signed)
Patient here today to have blood pressure checked.  Blood pressure was 172/90, pulse 85.  Patient sat for several minutes and blood pressure was rechecked at 176/95,pulse 80.  Patient is aware you are off today and will return tomorrow.

## 2020-03-31 ENCOUNTER — Other Ambulatory Visit: Payer: Self-pay

## 2020-03-31 ENCOUNTER — Ambulatory Visit (INDEPENDENT_AMBULATORY_CARE_PROVIDER_SITE_OTHER): Payer: Medicare Other | Admitting: Pharmacist

## 2020-03-31 DIAGNOSIS — E785 Hyperlipidemia, unspecified: Secondary | ICD-10-CM

## 2020-03-31 DIAGNOSIS — G720 Drug-induced myopathy: Secondary | ICD-10-CM | POA: Diagnosis not present

## 2020-03-31 MED ORDER — EZETIMIBE 10 MG PO TABS: 10.0000 mg | ORAL_TABLET | Freq: Every day | ORAL | 3 refills | Status: DC

## 2020-03-31 NOTE — Progress Notes (Signed)
° ° °  03/31/2020 Name: Maria Briggs MRN: [id number] DOB: Nov 20, 1950   HPI:  Maria Briggs is a 69 y.o. female patient referred to lipid clinic by PCP. PMH is significant for  Past Medical History:  Diagnosis Date   Arthritis    "maybe a little; comes & goes; various parts of my body" (02/20/2015)   Hyperlipidemia    PONV (postoperative nausea and vomiting)    Current Medications: amlodipine, asa 81, biotin, calcium, estadiol cream, meloxicam, minoxidil topical, omega3 (fish oil)  Intolerances: patient has tried and failed numerous statins,  Her rheumatologist recommends that she does not retry statin therapy  Risk Factors:  HTN  LDL goal: <100   Diet: encourage low fat, heart healthy diet; increased fiber  Exercise: n/a  Family History: n/a  Social History: does not smoke or drink alcohol   Labs:    Lipid Panel     Component Value Date/Time   CHOL 284 (H) 03/16/2020 1335   TRIG 90 03/16/2020 1335   HDL 89 03/16/2020 1335   CHOLHDL 3.2 03/16/2020 1335   LDLCALC 180 (H) 03/16/2020 1335   LABVLDL 15 03/16/2020 1335     Current Outpatient Medications on File Prior to Visit  Medication Sig Dispense Refill   amLODipine (NORVASC) 5 MG tablet Take 1 tablet (5 mg total) by mouth daily. 90 tablet 3   aspirin 81 MG tablet Take 81 mg by mouth daily.     Biotin 1 MG CAPS Take 1 capsule by mouth daily.     CALCIUM PO Take 600 mg by mouth daily.      estradiol (ESTRACE) 0.1 MG/GM vaginal cream Place 1 Applicatorful vaginally every other day.     meloxicam (MOBIC) 15 MG tablet Take 0.5-1 tablets (7.5-15 mg total) by mouth daily. 30 tablet 2   minoxidil (ROGAINE) 2 % external solution Apply topically 2 (two) times daily.     Omega-3 Fatty Acids (FISH OIL PO) Take 1,000 mg by mouth 2 (two) times daily.     No current facility-administered medications on file prior to visit.       Allergies  Allergen Reactions   Penicillins Hives   Rosuvastatin Calcium  Other (See Comments)    Muscle pain, weakness in limbs   Statins Other (See Comments)    Muscle pain, weakness in limbs    Assessment/Plan:    1. Hyperlipidemia -  Rheumatologist recommends patient not to take red yeast rice or statins Will trial zetia 10mg  daily -- since this is generic [employer] nexletol/nelizet in January when [employer] grant opens up Patient taking fish oil OTC, RX   Hypertension- BP today in office was 173/101, recheck 172/88--patient states her BP was in the 150s/70s at home today and has remained  Patient has been tracking BP 135-158/75-79 Encouraged patient to call if BP remains elevated; BP readings given to PCP for review  Regina Eck, PharmD, BCPS Clinical Pharmacist, [place]  [facility] Phone [phone]

## 2020-04-13 ENCOUNTER — Other Ambulatory Visit: Payer: Self-pay

## 2020-04-13 ENCOUNTER — Ambulatory Visit: Payer: Medicare Other | Admitting: *Deleted

## 2020-04-13 VITALS — BP 137/85 | HR 95

## 2020-04-13 DIAGNOSIS — Z0130 Encounter for examination of blood pressure without abnormal findings: Secondary | ICD-10-CM

## 2020-04-13 NOTE — Progress Notes (Signed)
Pt came in today for BP check per Dr Lajuana Ripple. Pt did bring her personal BP monitor in as well and her first BP reading with our monitor was 160/87 and hers read 183/68.Pt also states she did take her amlodipine 5mg  over an hour prior to her appt this morning. Pt sat for 15 minutes and repeated BP reading with our BP monitor was 137/85. I spoke with Dr Darnell Level and she said for pt to continue on her current BP medication dose and to purchase a new BP monitor to monitor BP's at home and to keep her routine follow up appt and to call with any changes. Pt voiced understanding.

## 2020-05-02 DIAGNOSIS — S46912A Strain of unspecified muscle, fascia and tendon at shoulder and upper arm level, left arm, initial encounter: Secondary | ICD-10-CM | POA: Diagnosis not present

## 2020-05-02 DIAGNOSIS — M79602 Pain in left arm: Secondary | ICD-10-CM | POA: Diagnosis not present

## 2020-05-02 DIAGNOSIS — M25522 Pain in left elbow: Secondary | ICD-10-CM | POA: Diagnosis not present

## 2020-05-06 ENCOUNTER — Encounter: Payer: Self-pay | Admitting: Nurse Practitioner

## 2020-05-06 ENCOUNTER — Ambulatory Visit (INDEPENDENT_AMBULATORY_CARE_PROVIDER_SITE_OTHER): Payer: Medicare Other

## 2020-05-06 ENCOUNTER — Ambulatory Visit (INDEPENDENT_AMBULATORY_CARE_PROVIDER_SITE_OTHER): Payer: Medicare Other | Admitting: Nurse Practitioner

## 2020-05-06 ENCOUNTER — Other Ambulatory Visit: Payer: Self-pay

## 2020-05-06 VITALS — BP 151/76 | HR 106 | Temp 98.0°F | Resp 20 | Ht 63.0 in | Wt 184.0 lb

## 2020-05-06 DIAGNOSIS — M25512 Pain in left shoulder: Secondary | ICD-10-CM

## 2020-05-06 MED ORDER — METHYLPREDNISOLONE ACETATE 40 MG/ML IJ SUSP
80.0000 mg | Freq: Once | INTRAMUSCULAR | Status: AC
  Administered 2020-05-06: 10:00:00 80 mg via INTRAMUSCULAR

## 2020-05-06 MED ORDER — PREDNISONE 10 MG (21) PO TBPK: ORAL_TABLET | ORAL | 0 refills | Status: DC

## 2020-05-06 MED ORDER — METHYLPREDNISOLONE ACETATE 80 MG/ML IJ SUSP: 80.0000 mg | Freq: Once | INTRAMUSCULAR | Status: DC

## 2020-05-06 NOTE — Assessment & Plan Note (Signed)
Acute pain of left shoulder not well managed.  Patient had a fall 7 days ago and went to the emergency department.  Patient was given 800 mg ibuprofen every 8 hours.  Patient is having limited range of motion, and reporting pain 5 and 6/10.  Advised patient to use ice compress, continue with NSAIDs as ordered from the emergency department, Depo-Medrol 80 given in office.  Prednisone pack sent to pharmacy.  Education provided with printed handouts given  Follow-up with worsening or unresolved symptoms

## 2020-05-06 NOTE — Progress Notes (Signed)
Acute Office Visit  Subjective:    Patient ID: Maria Briggs, female    DOB: 02-07-1951, 69 y.o.   MRN: [id number]  Chief Complaint  Patient presents with  . left shoulder pain     Shoulder Pain  The pain is present in the left hand and left shoulder. This is a new problem. The current episode started in the past 7 days. There has been a history of trauma. The problem occurs constantly. The problem has been gradually worsening. The quality of the pain is described as aching and pounding. The pain is at a severity of 5/10. The pain is moderate. Associated symptoms include tingling. Pertinent negatives include no fever, inability to bear weight or numbness. The symptoms are aggravated by activity. She has tried NSAIDS for the symptoms. The treatment provided mild relief.    Past Medical History:  Diagnosis Date  . Arthritis    "maybe a little; comes & goes; various parts of my body" (02/20/2015)  . Hyperlipidemia   . PONV (postoperative nausea and vomiting)     Past Surgical History:  Procedure Laterality Date  . BACK SURGERY    . BLADDER SUSPENSION  ~ 2005  . CHOLECYSTECTOMY  1973  . COLONOSCOPY    . COLONOSCOPY W/ BIOPSIES AND POLYPECTOMY    . ENDOMETRIAL ABLATION  2006?  Marland Kitchen HERNIA REPAIR    . INCISIONAL HERNIA REPAIR N/A 02/20/2015   Procedure: LAPAROSCOPIC INCISIONAL HERNIA REPAIR WITH MESH;  Surgeon: Coralie Keens, MD;  Location: [facility];  Service: General;  Laterality: N/A;  . INSERTION OF MESH N/A 02/20/2015   Procedure: INSERTION OF MESH;  Surgeon: Coralie Keens, MD;  Location: [facility];  Service: General;  Laterality: N/A;  . LAPAROSCOPIC INCISIONAL / UMBILICAL / [place]  02/20/2015   incisional w/mesh  . [place] SURGERY  1993  . MOLE REMOVAL  2000's   "off my back; no cancer"  . TONSILLECTOMY  1959  . TUBAL LIGATION  1979    Family History  Problem Relation Age of Onset  . Colon polyps Father   . Colon cancer Father        ?  . Prostate  cancer Father   . Dementia Father   . COPD Father   . Anxiety disorder Mother   . COPD Mother   . Depression Mother   . Heart attack Brother 29  . Mood Disorder Son   . Stroke Paternal Grandmother   . Diabetes Paternal Grandmother   . Stroke Paternal Grandfather   . Esophageal cancer Neg Hx   . Stomach cancer Neg Hx   . Rectal cancer Neg Hx     Social History   Socioeconomic History  . Marital status: Married    Spouse name: Not on file  . Number of children: Not on file  . Years of education: Not on file  . Highest education level: Not on file  Occupational History  . Not on file  Tobacco Use  . Smoking status: Never Smoker  . Smokeless tobacco: Never Used  Vaping Use  . Vaping Use: Never used  Substance and Sexual Activity  . Alcohol use: Yes    Comment: 02/20/2015 "might have a drink q 6 months"  . Drug use: No  . Sexual activity: Not Currently  Other Topics Concern  . Not on file  Social History Narrative  . Not on file   Social Determinants of Health   Financial Resource Strain: Not on file  Food Insecurity: Not on file  Transportation Needs: Not on file  Physical Activity: Not on file  Stress: Not on file  Social Connections: Not on file  Intimate Partner Violence: Not on file    Outpatient Medications Prior to Visit  Medication Sig Dispense Refill  . amLODipine (NORVASC) 5 MG tablet Take 1 tablet (5 mg total) by mouth daily. 90 tablet 3  . aspirin 81 MG tablet Take 81 mg by mouth daily.    . Biotin 5 MG CAPS Take 1 capsule by mouth daily.     Marland Kitchen CALCIUM PO Take 600 mg by mouth daily.     Marland Kitchen estradiol (ESTRACE) 0.1 MG/GM vaginal cream Place 1 Applicatorful vaginally every other day.    . ezetimibe (ZETIA) 10 MG tablet Take 1 tablet (10 mg total) by mouth daily. 30 tablet 3  . meloxicam (MOBIC) 15 MG tablet Take 0.5-1 tablets (7.5-15 mg total) by mouth daily. 30 tablet 2  . minoxidil (ROGAINE) 2 % external solution Apply topically 2 (two) times daily.     . Omega-3 Fatty Acids (FISH OIL PO) Take 1,000 mg by mouth 2 (two) times daily.    Marland Kitchen ibuprofen (ADVIL) 800 MG tablet Take 800 mg by mouth 3 (three) times daily.     No facility-administered medications prior to visit.    Allergies  Allergen Reactions  . Penicillins Hives  . Rosuvastatin Calcium Other (See Comments)    Muscle pain, weakness in limbs  . Statins Other (See Comments)    Muscle pain, weakness in limbs    Review of Systems  Constitutional: Negative for fever.  HENT: Negative.   Eyes: Negative.   Respiratory: Negative.   Cardiovascular: Negative.   Gastrointestinal: Negative.   Genitourinary: Negative.   Musculoskeletal: Positive for myalgias.  Skin: Negative for color change.  Neurological: Positive for tingling. Negative for numbness.  All other systems reviewed and are negative.      Objective:    Physical Exam Vitals reviewed.  Constitutional:      General: She is awake.     Appearance: She is overweight.     Interventions: Face mask in place.  HENT:     Head: Normocephalic.     Nose: Nose normal.  Cardiovascular:     Rate and Rhythm: Normal rate and regular rhythm.     Pulses: Normal pulses.     Heart sounds: Normal heart sounds.  Pulmonary:     Effort: Pulmonary effort is normal.     Breath sounds: Normal breath sounds.  Abdominal:     General: Bowel sounds are normal.  Musculoskeletal:        General: Tenderness and signs of injury present.  Skin:    General: Skin is warm.  Neurological:     Mental Status: She is alert and oriented to person, place, and time.  Psychiatric:        Mood and Affect: Mood normal.        Behavior: Behavior normal. Behavior is cooperative.     BP (!) 151/76 (BP Location: Right Arm, Cuff Size: Large)   Pulse (!) 106   Temp 98 F (36.7 C)   Resp 20   Ht 5\' 3"  (1.6 m)   Wt 184 lb (83.5 kg)   SpO2 96%   BMI 32.59 kg/m  Wt Readings from Last 3 Encounters:  05/06/20 184 lb (83.5 kg)  03/16/20 181 lb 6.4  oz (82.3 kg)  03/22/19 178 lb (80.7 kg)    Health  Maintenance Due  Topic Date Due  . TETANUS/TDAP  Never done  . DEXA SCAN  Never done    There are no preventive care reminders to display for this patient.   Lab Results  Component Value Date   TSH 2.210 01/01/2019   Lab Results  Component Value Date   WBC 5.2 01/01/2019   HGB 13.9 01/01/2019   HCT 41.4 01/01/2019   MCV 90 01/01/2019   PLT 259 01/01/2019   Lab Results  Component Value Date   NA 141 03/16/2020   K 4.7 03/16/2020   CO2 25 03/16/2020   GLUCOSE 95 03/16/2020   BUN 18 03/16/2020   CREATININE 0.77 03/16/2020   BILITOT 1.1 03/16/2020   ALKPHOS 77 03/16/2020   AST 13 03/16/2020   ALT 12 03/16/2020   PROT 6.8 03/16/2020   ALBUMIN 4.4 03/16/2020   CALCIUM 9.4 03/16/2020   Lab Results  Component Value Date   CHOL 284 (H) 03/16/2020   Lab Results  Component Value Date   HDL 89 03/16/2020   Lab Results  Component Value Date   LDLCALC 180 (H) 03/16/2020   Lab Results  Component Value Date   TRIG 90 03/16/2020   Lab Results  Component Value Date   CHOLHDL 3.2 03/16/2020   Lab Results  Component Value Date   HGBA1C 5.4 03/16/2020       Assessment & Plan:   Problem List Items Addressed This Visit      Other   Acute pain of left shoulder - Primary    Acute pain of left shoulder not well managed.  Patient had a fall 7 days ago and went to the emergency department.  Patient was given 800 mg ibuprofen every 8 hours.  Patient is having limited range of motion, and reporting pain 5 and 6/10.  Advised patient to use ice compress, continue with NSAIDs as ordered from the emergency department, Depo-Medrol 80 given in office.  Prednisone pack sent to pharmacy.  Education provided with printed handouts given  Follow-up with worsening or unresolved symptoms      Relevant Medications   predniSONE (STERAPRED UNI-PAK 21 TAB) 10 MG (21) TBPK tablet   Other Relevant Orders   DG Shoulder Left  (Completed)        Ivy Lynn, NP

## 2020-05-06 NOTE — Patient Instructions (Signed)
Shoulder Pain Many things can cause shoulder pain, including:  An injury.  Moving the shoulder in the same way again and again (overuse).  Joint pain (arthritis). Pain can come from:  Swelling and irritation (inflammation) of any part of the shoulder.  An injury to the shoulder joint.  An injury to: ? Tissues that connect muscle to bone (tendons). ? Tissues that connect bones to each other (ligaments). ? Bones. Follow these instructions at home: Watch for changes in your symptoms. Let your doctor know about them. Follow these instructions to help with your pain. If you have a sling:  Wear the sling as told by your doctor. Remove it only as told by your doctor.  Loosen the sling if your fingers: ? Tingle. ? Become numb. ? Turn cold and blue.  Keep the sling clean.  If the sling is not waterproof: ? Do not let it get wet. ? Take the sling off when you shower or bathe. Managing pain, stiffness, and swelling   If told, put ice on the painful area: ? Put ice in a plastic bag. ? Place a towel between your skin and the bag. ? Leave the ice on for 20 minutes, 2-3 times a day. Stop putting ice on if it does not help with the pain.  Squeeze a soft ball or a foam pad as much as possible. This prevents swelling in the shoulder. It also helps to strengthen the arm. General instructions  Take over-the-counter and prescription medicines only as told by your doctor.  Keep all follow-up visits as told by your doctor. This is important. Contact a doctor if:  Your pain gets worse.  Medicine does not help your pain.  You have new pain in your arm, hand, or fingers. Get help right away if:  Your arm, hand, or fingers: ? Tingle. ? Are numb. ? Are swollen. ? Are painful. ? Turn white or blue. Summary  Shoulder pain can be caused by many things. These include injury, moving the shoulder in the same away again and again, and joint pain.  Watch for changes in your symptoms.  Let your doctor know about them.  This condition may be treated with a sling, ice, and pain medicine.  Contact your doctor if the pain gets worse or you have new pain. Get help right away if your arm, hand, or fingers tingle or get numb, swollen, or painful.  Keep all follow-up visits as told by your doctor. This is important. This information is not intended to replace advice given to you by your health care provider. Make sure you discuss any questions you have with your health care provider. Document Revised: 11/21/2017 Document Reviewed: 11/21/2017 Elsevier Patient Education  2020 [employer]  

## 2020-06-26 DIAGNOSIS — Z1231 Encounter for screening mammogram for malignant neoplasm of breast: Secondary | ICD-10-CM | POA: Diagnosis not present

## 2020-07-02 ENCOUNTER — Other Ambulatory Visit: Payer: Self-pay | Admitting: Family Medicine

## 2020-07-07 ENCOUNTER — Encounter: Payer: Self-pay | Admitting: Nurse Practitioner

## 2020-07-07 ENCOUNTER — Ambulatory Visit (INDEPENDENT_AMBULATORY_CARE_PROVIDER_SITE_OTHER): Payer: Medicare Other | Admitting: Nurse Practitioner

## 2020-07-07 ENCOUNTER — Telehealth: Payer: Self-pay

## 2020-07-07 ENCOUNTER — Other Ambulatory Visit: Payer: Self-pay

## 2020-07-07 ENCOUNTER — Ambulatory Visit (INDEPENDENT_AMBULATORY_CARE_PROVIDER_SITE_OTHER): Payer: Medicare Other

## 2020-07-07 VITALS — BP 140/83 | HR 98 | Temp 97.7°F | Ht 63.0 in | Wt 183.6 lb

## 2020-07-07 DIAGNOSIS — R109 Unspecified abdominal pain: Secondary | ICD-10-CM | POA: Diagnosis not present

## 2020-07-07 DIAGNOSIS — R1032 Left lower quadrant pain: Secondary | ICD-10-CM | POA: Diagnosis not present

## 2020-07-07 NOTE — Patient Instructions (Signed)
Use Mobic to help with pain, Follow up with worsening or unresolved symptoms.  Abdominal Pain, Adult Many things can cause belly (abdominal) pain. Most times, belly pain is not dangerous. Many cases of belly pain can be watched and treated at home. Sometimes, though, belly pain is serious. Your doctor will try to find the cause of your belly pain. Follow these instructions at home: Medicines  Take over-the-counter and prescription medicines only as told by your doctor.  Do not take medicines that help you poop (laxatives) unless told by your doctor. General instructions  Watch your belly pain for any changes.  Drink enough fluid to keep your pee (urine) pale yellow.  Keep all follow-up visits as told by your doctor. This is important.   Contact a doctor if:  Your belly pain changes or gets worse.  You are not hungry, or you lose weight without trying.  You are having trouble pooping (constipated) or have watery poop (diarrhea) for more than 2-3 days.  You have pain when you pee or poop.  Your belly pain wakes you up at night.  Your pain gets worse with meals, after eating, or with certain foods.  You are vomiting and cannot keep anything down.  You have a fever.  You have blood in your pee. Get help right away if:  Your pain does not go away as soon as your doctor says it should.  You cannot stop vomiting.  Your pain is only in areas of your belly, such as the right side or the left lower part of the belly.  You have bloody or black poop, or poop that looks like tar.  You have very bad pain, cramping, or bloating in your belly.  You have signs of not having enough fluid or water in your body (dehydration), such as: ? Dark pee, very little pee, or no pee. ? Cracked lips. ? Dry mouth. ? Sunken eyes. ? Sleepiness. ? Weakness.  You have trouble breathing or chest pain. Summary  Many cases of belly pain can be watched and treated at home.  Watch your belly pain  for any changes.  Take over-the-counter and prescription medicines only as told by your doctor.  Contact a doctor if your belly pain changes or gets worse.  Get help right away if you have very bad pain, cramping, or bloating in your belly. This information is not intended to replace advice given to you by your health care provider. Make sure you discuss any questions you have with your health care provider. Document Revised: 09/17/2018 Document Reviewed: 09/17/2018 Elsevier Patient Education  [employer].

## 2020-07-07 NOTE — Assessment & Plan Note (Signed)
This is new symptoms for patient in the last 3 days.  Symptoms of pain/discomfort.  Patient rates pain as mild and intermittent, worsens when area is palpated.  Assessment completed no rebound tenderness, no CVA tenderness.  No constipation, nausea or vomiting.  Patient was treated for UTI symptoms last week but denies any UTI symptoms today.  X-ray of left lower abdomen completed results pending.  We will follow-up with ultrasound if x-ray is not showing the cause of abdominal pain.  Advised patient to use anti-inflammatory for pain, increase hydration.  Education provided to patient printed handouts given. Follow-up with worsening unresolved symptoms.

## 2020-07-07 NOTE — Progress Notes (Signed)
Acute Office Visit  Subjective:    Patient ID: Maria Briggs, female    DOB: [birth year], 70 y.o.   MRN: [id number]  Chief Complaint  Patient presents with  . Abdominal Pain    Left lower quadrant x 3 days    Abdominal Pain This is a new problem. Episode onset: in the past 3 days. The onset quality is gradual. The problem occurs intermittently. The problem has been unchanged. The pain is located in the LLQ. The pain is moderate. The quality of the pain is aching. The abdominal pain does not radiate. Associated symptoms include flatus. Pertinent negatives include no belching, constipation, fever, nausea or vomiting. The pain is aggravated by movement. The pain is relieved by sitting up. She has tried antacids for the symptoms. The treatment provided mild relief.     Past Medical History:  Diagnosis Date  . Arthritis    "maybe a little; comes & goes; various parts of my body" (02/20/2015)  . Hyperlipidemia   . PONV (postoperative nausea and vomiting)     Past Surgical History:  Procedure Laterality Date  . BACK SURGERY    . BLADDER SUSPENSION  ~ 2005  . CHOLECYSTECTOMY  1973  . COLONOSCOPY    . COLONOSCOPY W/ BIOPSIES AND POLYPECTOMY    . ENDOMETRIAL ABLATION  2006?  Marland Kitchen HERNIA REPAIR    . INCISIONAL HERNIA REPAIR N/A 02/20/2015   Procedure: LAPAROSCOPIC INCISIONAL HERNIA REPAIR WITH MESH;  Surgeon: Coralie Keens, MD;  Location: [facility];  Service: General;  Laterality: N/A;  . INSERTION OF MESH N/A 02/20/2015   Procedure: INSERTION OF MESH;  Surgeon: Coralie Keens, MD;  Location: [facility];  Service: General;  Laterality: N/A;  . LAPAROSCOPIC INCISIONAL / UMBILICAL / [place]  02/20/2015   incisional w/mesh  . [place] SURGERY  1993  . MOLE REMOVAL  2000's   "off my back; no cancer"  . TONSILLECTOMY  1959  . TUBAL LIGATION  1979    Family History  Problem Relation Age of Onset  . Colon polyps Father   . Colon cancer Father        ?  . Prostate cancer  Father   . Dementia Father   . COPD Father   . Anxiety disorder Mother   . COPD Mother   . Depression Mother   . Heart attack Brother 63  . Mood Disorder Son   . Stroke Paternal Grandmother   . Diabetes Paternal Grandmother   . Stroke Paternal Grandfather   . Esophageal cancer Neg Hx   . Stomach cancer Neg Hx   . Rectal cancer Neg Hx       Outpatient Medications Prior to Visit  Medication Sig Dispense Refill  . amLODipine (NORVASC) 5 MG tablet Take 1 tablet (5 mg total) by mouth daily. 90 tablet 3  . aspirin 81 MG tablet Take 81 mg by mouth daily.    . Biotin 5 MG CAPS Take 1 capsule by mouth daily.     Marland Kitchen CALCIUM PO Take 600 mg by mouth daily.     Marland Kitchen estradiol (ESTRACE) 0.1 MG/GM vaginal cream Place 1 Applicatorful vaginally every other day.    . ezetimibe (ZETIA) 10 MG tablet TAKE 1 TABLET BY MOUTH EVERY DAY 30 tablet 1  . ibuprofen (ADVIL) 800 MG tablet Take 800 mg by mouth 3 (three) times daily.    . meloxicam (MOBIC) 15 MG tablet Take 0.5-1 tablets (7.5-15 mg total) by mouth daily. 30 tablet  2  . minoxidil (ROGAINE) 2 % external solution Apply topically 2 (two) times daily.    . Omega-3 Fatty Acids (FISH OIL PO) Take 1,000 mg by mouth 2 (two) times daily.    . predniSONE (STERAPRED UNI-PAK 21 TAB) 10 MG (21) TBPK tablet 6 tablets by mouth on day 1, 5 tablets day two 4 tablet day 3 3 tablets daily 4, 2 tablet day 5, 1 tablet day 6. 1 each 0   No facility-administered medications prior to visit.    Allergies  Allergen Reactions  . Penicillins Hives  . Rosuvastatin Calcium Other (See Comments)    Muscle pain, weakness in limbs  . Statins Other (See Comments)    Muscle pain, weakness in limbs    Review of Systems  Constitutional: Negative.  Negative for fever.  HENT: Negative.   Eyes: Negative.   Respiratory: Negative.   Cardiovascular: Negative.   Gastrointestinal: Positive for abdominal pain and flatus. Negative for constipation, nausea and vomiting.   Genitourinary: Negative.   Musculoskeletal: Negative.   Skin: Negative.   All other systems reviewed and are negative.      Objective:    Physical Exam Vitals reviewed.  Constitutional:      Appearance: She is well-developed.  HENT:     Head: Normocephalic.  Cardiovascular:     Rate and Rhythm: Normal rate and regular rhythm.  Pulmonary:     Effort: Pulmonary effort is normal.     Breath sounds: Normal breath sounds.  Abdominal:     General: Bowel sounds are normal.     Tenderness: There is abdominal tenderness. There is no right CVA tenderness, left CVA tenderness or rebound.  Musculoskeletal:        General: Tenderness present.  Skin:    General: Skin is warm.  Neurological:     Mental Status: She is alert and oriented to person, place, and time.  Psychiatric:        Mood and Affect: Mood normal.        Behavior: Behavior normal.     BP 140/83   Pulse 98   Temp 97.7 F (36.5 C)   Ht 5\' 3"  (1.6 m)   Wt 183 lb 9.6 oz (83.3 kg)   SpO2 98%   BMI 32.52 kg/m  Wt Readings from Last 3 Encounters:  07/07/20 183 lb 9.6 oz (83.3 kg)  05/06/20 184 lb (83.5 kg)  03/16/20 181 lb 6.4 oz (82.3 kg)    Health Maintenance Due  Topic Date Due  . TETANUS/TDAP  Never done  . DEXA SCAN  Never done    There are no preventive care reminders to display for this patient.   Lab Results  Component Value Date   TSH 2.210 01/01/2019   Lab Results  Component Value Date   WBC 5.2 01/01/2019   HGB 13.9 01/01/2019   HCT 41.4 01/01/2019   MCV 90 01/01/2019   PLT 259 01/01/2019   Lab Results  Component Value Date   NA 141 03/16/2020   K 4.7 03/16/2020   CO2 25 03/16/2020   GLUCOSE 95 03/16/2020   BUN 18 03/16/2020   CREATININE 0.77 03/16/2020   BILITOT 1.1 03/16/2020   ALKPHOS 77 03/16/2020   AST 13 03/16/2020   ALT 12 03/16/2020   PROT 6.8 03/16/2020   ALBUMIN 4.4 03/16/2020   CALCIUM 9.4 03/16/2020   Lab Results  Component Value Date   CHOL 284 (H) 03/16/2020    Lab Results  Component Value Date  HDL 89 03/16/2020   Lab Results  Component Value Date   LDLCALC 180 (H) 03/16/2020   Lab Results  Component Value Date   TRIG 90 03/16/2020   Lab Results  Component Value Date   CHOLHDL 3.2 03/16/2020   Lab Results  Component Value Date   HGBA1C 5.4 03/16/2020       Assessment & Plan:   Problem List Items Addressed This Visit      Other   Left lower quadrant abdominal pain - Primary    This is new symptoms for patient in the last 3 days.  Symptoms of pain/discomfort.  Patient rates pain as mild and intermittent, worsens when area is palpated.  Assessment completed no rebound tenderness, no CVA tenderness.  No constipation, nausea or vomiting.  Patient was treated for UTI symptoms last week but denies any UTI symptoms today.  X-ray of left lower abdomen completed results pending.  We will follow-up with ultrasound if x-ray is not showing the cause of abdominal pain.  Advised patient to use anti-inflammatory for pain, increase hydration.  Education provided to patient printed handouts given. Follow-up with worsening unresolved symptoms.      Relevant Orders   DG Abd 2 Views       No orders of the defined types were placed in this encounter.    Ivy Lynn, NP

## 2020-07-15 ENCOUNTER — Ambulatory Visit (INDEPENDENT_AMBULATORY_CARE_PROVIDER_SITE_OTHER): Payer: Medicare Other | Admitting: Family Medicine

## 2020-07-15 ENCOUNTER — Encounter: Payer: Self-pay | Admitting: Family Medicine

## 2020-07-15 ENCOUNTER — Other Ambulatory Visit: Payer: Self-pay

## 2020-07-15 VITALS — BP 138/78 | HR 89 | Temp 97.9°F | Ht 63.0 in | Wt 182.0 lb

## 2020-07-15 DIAGNOSIS — E7800 Pure hypercholesterolemia, unspecified: Secondary | ICD-10-CM | POA: Diagnosis not present

## 2020-07-15 DIAGNOSIS — I10 Essential (primary) hypertension: Secondary | ICD-10-CM | POA: Diagnosis not present

## 2020-07-15 DIAGNOSIS — M25512 Pain in left shoulder: Secondary | ICD-10-CM | POA: Diagnosis not present

## 2020-07-15 DIAGNOSIS — Z23 Encounter for immunization: Secondary | ICD-10-CM

## 2020-07-15 DIAGNOSIS — G8929 Other chronic pain: Secondary | ICD-10-CM

## 2020-07-15 DIAGNOSIS — Z780 Asymptomatic menopausal state: Secondary | ICD-10-CM | POA: Diagnosis not present

## 2020-07-15 NOTE — Progress Notes (Signed)
Subjective: CC: HTN, HLD PCP: Maria Norlander, DO Maria Briggs is a 70 y.o. female presenting to clinic today for:  1.  Hypertension with hyperlipidemia Patient reports compliance with her Zetia and Norvasc.  She is been tolerating this medication without difficulty.  Denies any chest pain, shortness of breath or abdominal pain.  She is fasting today for labs.  No reports of increased musculoskeletal pain with this medicine  2.  Left shoulder pain Patient reports that she fell and injured her left shoulder while walking a dog.  She initially had great reduced range of motion but seems to be gradually improving.  She does have a palpable defect and she would like me to look at this today.  Uses meloxicam as needed pain   ROS: Per HPI  Allergies  Allergen Reactions  . Penicillins Hives  . Rosuvastatin Calcium Other (See Comments)    Muscle pain, weakness in limbs  . Statins Other (See Comments)    Muscle pain, weakness in limbs   Past Medical History:  Diagnosis Date  . Arthritis    "maybe a little; comes & goes; various parts of my body" (02/20/2015)  . Hyperlipidemia   . PONV (postoperative nausea and vomiting)     Current Outpatient Medications:  .  amLODipine (NORVASC) 5 MG tablet, Take 1 tablet (5 mg total) by mouth daily., Disp: 90 tablet, Rfl: 3 .  aspirin 81 MG tablet, Take 81 mg by mouth daily., Disp: , Rfl:  .  Biotin 5 MG CAPS, Take 1 capsule by mouth daily. , Disp: , Rfl:  .  CALCIUM PO, Take 600 mg by mouth daily. , Disp: , Rfl:  .  estradiol (ESTRACE) 0.1 MG/GM vaginal cream, Place 1 Applicatorful vaginally every other day., Disp: , Rfl:  .  ezetimibe (ZETIA) 10 MG tablet, TAKE 1 TABLET BY MOUTH EVERY DAY, Disp: 30 tablet, Rfl: 1 .  ibuprofen (ADVIL) 800 MG tablet, Take 800 mg by mouth 3 (three) times daily., Disp: , Rfl:  .  meloxicam (MOBIC) 15 MG tablet, Take 0.5-1 tablets (7.5-15 mg total) by mouth daily., Disp: 30 tablet, Rfl: 2 .  minoxidil  (ROGAINE) 2 % external solution, Apply topically 2 (two) times daily., Disp: , Rfl:  .  Omega-3 Fatty Acids (FISH OIL PO), Take 1,000 mg by mouth 2 (two) times daily., Disp: , Rfl:  Social History   Socioeconomic History  . Marital status: Married    Spouse name: Not on file  . Number of children: Not on file  . Years of education: Not on file  . Highest education level: Not on file  Occupational History  . Not on file  Tobacco Use  . Smoking status: Never Smoker  . Smokeless tobacco: Never Used  Vaping Use  . Vaping Use: Never used  Substance and Sexual Activity  . Alcohol use: Yes    Comment: 02/20/2015 "might have a drink q 6 months"  . Drug use: No  . Sexual activity: Not Currently  Other Topics Concern  . Not on file  Social History Narrative  . Not on file   Social Determinants of Health   Financial Resource Strain: Not on file  Food Insecurity: Not on file  Transportation Needs: Not on file  Physical Activity: Not on file  Stress: Not on file  Social Connections: Not on file  Intimate Partner Violence: Not on file   Family History  Problem Relation Age of Onset  . Colon polyps Father   .  Colon cancer Father        ?  . Prostate cancer Father   . Dementia Father   . COPD Father   . Anxiety disorder Mother   . COPD Mother   . Depression Mother   . Heart attack Brother 11  . Mood Disorder Son   . Stroke Paternal Grandmother   . Diabetes Paternal Grandmother   . Stroke Paternal Grandfather   . Esophageal cancer Neg Hx   . Stomach cancer Neg Hx   . Rectal cancer Neg Hx     Objective: Office vital signs reviewed. BP 138/78   Pulse 89   Temp 97.9 F (36.6 C) (Temporal)   Ht 5\' 3"  (1.6 m)   Wt 182 lb (82.6 kg)   SpO2 96%   BMI 32.24 kg/m   Physical Examination:  General: Awake, alert, well nourished, No acute distress HEENT: Normal; sclera white.  Moist mucous membranes Cardio: regular rate and rhythm, S1S2 heard, no murmurs appreciated Pulm:  clear to auscultation bilaterally, no wheezes, rhonchi or rales; normal work of breathing on room air Extremities: warm, well perfused, No edema, cyanosis or clubbing; +2 pulses bilaterally MSK: Minimally reduced active range of motion AB duction.  There is a palpable soft tissue thickening noted along the anterior upper aspect of the left shoulder felt to be scar tissue  Assessment/ Plan: 70 y.o. female   Essential hypertension  Pure hypercholesterolemia - Plan: Lipid Panel, Hepatic Function Panel  Asymptomatic postmenopausal state - Plan: DG [facility] DEXA  Need for tetanus booster  Chronic left shoulder pain  Blood pressures well controlled.  Continue current regimen.   Check lipid panel, hepatic function panel.  We will check DEXA today.  Tetanus booster administered  Left shoulder pain seems to be improving status post injury.  Advised to continue active range of motion exercises and to follow-up with any symptoms are worsening for any reason.  Could consider referral at that point  No orders of the defined types were placed in this encounter.  No orders of the defined types were placed in this encounter.    Maria Norlander, DO [place] [phone]

## 2020-07-15 NOTE — Patient Instructions (Signed)
You got your tetanus booster today  Ok to use miralax as needed for constipation  You had labs performed today.  You will be contacted with the results of the labs once they are available, usually in the next 3 business days for routine lab work.  If you have an active my chart account, they will be released to your MyChart.  If you prefer to have these labs released to you via telephone, please let us know.  If you had a pap smear or biopsy performed, expect to be contacted in about 7-10 days.

## 2020-07-16 LAB — LIPID PANEL
Chol/HDL Ratio: 3.1 ratio (ref 0.0–4.4)
Cholesterol, Total: 236 mg/dL — ABNORMAL HIGH (ref 100–199)
HDL: 77 mg/dL (ref >39)
LDL Chol Calc (NIH): 143 mg/dL — ABNORMAL HIGH (ref 0–99)
Triglycerides: 92 mg/dL (ref 0–149)
VLDL Cholesterol Cal: 16 mg/dL (ref 5–40)

## 2020-07-16 LAB — HEPATIC FUNCTION PANEL
ALT: 12 IU/L (ref 0–32)
AST: 15 IU/L (ref 0–40)
Albumin: 4.4 g/dL (ref 3.8–4.8)
Alkaline Phosphatase: 78 IU/L (ref 44–121)
Bilirubin Total: 0.8 mg/dL (ref 0.0–1.2)
Bilirubin, Direct: 0.18 mg/dL (ref 0.00–0.40)
Total Protein: 6.9 g/dL (ref 6.0–8.5)

## 2020-07-22 ENCOUNTER — Ambulatory Visit (INDEPENDENT_AMBULATORY_CARE_PROVIDER_SITE_OTHER): Payer: Medicare Other | Admitting: *Deleted

## 2020-07-22 DIAGNOSIS — Z0000 Encounter for general adult medical examination without abnormal findings: Secondary | ICD-10-CM

## 2020-07-22 NOTE — Progress Notes (Signed)
MEDICARE ANNUAL WELLNESS VISIT  07/22/2020  Telephone Visit Disclaimer This Medicare AWV was conducted by telephone due to national recommendations for restrictions regarding the COVID-19 Pandemic (e.g. social distancing).  I verified, using two identifiers, that I am speaking with Maria Briggs or their authorized healthcare agent. I discussed the limitations, risks, security, and privacy concerns of performing an evaluation and management service by telephone and the potential availability of an in-person appointment in the future. The patient expressed understanding and agreed to proceed.  Location of Patient: Home Location of Provider (nurse): [facility]  Subjective:    Maria Briggs is a 70 y.o. female patient of Maria Norlander, DO who had a Medicare Annual Wellness Visit today via telephone. Maria Briggs is retired and lives with her husband Maria Briggs. She has 2 children, 1 that lives locally. She reports that she is socially active and does interact with friends/family regularly. She is minimally physically active and enjoys reading.  Patient Care Team: Maria Norlander, DO as PCP - General (Family Medicine)  Advanced Directives 07/22/2020 04/08/2019 03/24/2017 02/20/2015 02/11/2015  Does Patient Have a Medical Advance Directive? Yes Yes Yes Yes Yes  Type of Paramedic of [place];Living will;Out of facility DNR (pink MOST or yellow form) - [place];Living will Living will;Healthcare Power of Attorney Living will;Healthcare Power of Attorney  Does patient want to make changes to medical advance directive? No - Patient declined - - No - Patient declined No - Patient declined  Copy of [place] in Chart? No - copy requested - - No - copy requested No - copy requested    Hospital Utilization Over the Past 12 Months: # of hospitalizations or ER visits: 0 # of surgeries: 0  Review of Systems    Patient reports that her  overall health is unchanged compared to last year.  History obtained from the patient and patient chart.   Patient Reported Readings (BP, Pulse, CBG, Weight, etc) none  Pain Assessment Pain : No/denies pain     Current Medications & Allergies (verified) Allergies as of 07/22/2020      Reactions   Penicillins Hives   Rosuvastatin Calcium Other (See Comments)   Muscle pain, weakness in limbs   Statins Other (See Comments)   Muscle pain, weakness in limbs      Medication List       Accurate as of July 22, 2020  9:22 AM. If you have any questions, ask your nurse or doctor.        amLODipine 5 MG tablet Commonly known as: NORVASC Take 1 tablet (5 mg total) by mouth daily.   aspirin 81 MG tablet Take 81 mg by mouth daily.   Biotin 5 MG Caps Take 1 capsule by mouth daily.   CALCIUM PO Take 600 mg by mouth daily.   estradiol 0.1 MG/GM vaginal cream Commonly known as: ESTRACE Place 1 Applicatorful vaginally every other day.   ezetimibe 10 MG tablet Commonly known as: ZETIA TAKE 1 TABLET BY MOUTH EVERY DAY   FISH OIL PO Take 1,000 mg by mouth 2 (two) times daily.   ibuprofen 800 MG tablet Commonly known as: ADVIL Take 800 mg by mouth 3 (three) times daily.   meloxicam 15 MG tablet Commonly known as: MOBIC Take 0.5-1 tablets (7.5-15 mg total) by mouth daily.   minoxidil 2 % external solution Commonly known as: ROGAINE Apply topically 2 (two) times daily.  History (reviewed): Past Medical History:  Diagnosis Date  . Arthritis    "maybe a little; comes & goes; various parts of my body" (02/20/2015)  . Hyperlipidemia   . PONV (postoperative nausea and vomiting)    Past Surgical History:  Procedure Laterality Date  . BACK SURGERY    . BLADDER SUSPENSION  ~ 2005  . CHOLECYSTECTOMY  1973  . COLONOSCOPY    . COLONOSCOPY W/ BIOPSIES AND POLYPECTOMY    . ENDOMETRIAL ABLATION  2006?  [facility] HERNIA REPAIR    . INCISIONAL HERNIA REPAIR N/A 02/20/2015    Procedure: LAPAROSCOPIC INCISIONAL HERNIA REPAIR WITH MESH;  Surgeon: Coralie Keens, MD;  Location: [facility];  Service: General;  Laterality: N/A;  . INSERTION OF MESH N/A 02/20/2015   Procedure: INSERTION OF MESH;  Surgeon: Coralie Keens, MD;  Location: [facility];  Service: General;  Laterality: N/A;  . LAPAROSCOPIC INCISIONAL / UMBILICAL / [place]  02/20/2015   incisional w/mesh  . [place] SURGERY  1993  . MOLE REMOVAL  2000's   "off my back; no cancer"  . TONSILLECTOMY  1959  . TUBAL LIGATION  1979   Family History  Problem Relation Age of Onset  . Colon polyps Father   . Colon cancer Father        ?  . Prostate cancer Father   . Dementia Father   . COPD Father   . Anxiety disorder Mother   . COPD Mother   . Depression Mother   . Heart attack Brother 59  . Mood Disorder Son   . Stroke Paternal Grandmother   . Diabetes Paternal Grandmother   . Stroke Paternal Grandfather   . Esophageal cancer Neg Hx   . Stomach cancer Neg Hx   . Rectal cancer Neg Hx    Social History   Socioeconomic History  . Marital status: Married    Spouse name: Maria Briggs  . Number of children: 2  . Years of education: Not on file  . Highest education level: High school graduate  Occupational History  . Occupation: retired  Tobacco Use  . Smoking status: Never Smoker  . Smokeless tobacco: Never Used  Vaping Use  . Vaping Use: Never used  Substance and Sexual Activity  . Alcohol use: Not Currently    Comment: 02/20/2015 "might have a drink q 6 months"  . Drug use: No  . Sexual activity: Not Currently  Other Topics Concern  . Not on file  Social History Narrative   Retired, lives with husband Maria Briggs, 2 children, enjoys reading and puzzles   Social Determinants of Health   Financial Resource Strain: Not on file  Food Insecurity: Not on file  Transportation Needs: Not on file  Physical Activity: Not on file  Stress: Not on file  Social Connections: Not on file    Activities  of Daily Living In your present state of health, do you have any difficulty performing the following activities: 07/22/2020  Hearing? Y  Comment a little bit  Vision? N  Difficulty concentrating or making decisions? N  Walking or climbing stairs? N  Dressing or bathing? N  Doing errands, shopping? N  Preparing Food and eating ? N  Using the Toilet? N  In the past six months, have you accidently leaked urine? N  Do you have problems with loss of bowel control? N  Managing your Medications? N  Managing your Finances? N  Housekeeping or managing your Housekeeping? N  Some recent data might  be hidden    Patient Education/ Literacy How often do you need to have someone help you when you read instructions, pamphlets, or other written materials from your doctor or pharmacy?: 1 - Never What is the last grade level you completed in school?: 12th  Exercise Current Exercise Habits: The patient does not participate in regular exercise at present, Exercise limited by: None identified  Diet Patient reports consuming 3 meals a day and 2 snack(s) a day Patient reports that her primary diet is: Regular Patient reports that she does have regular access to food.   Depression Screen PHQ 2/9 Scores 07/22/2020 07/15/2020 07/07/2020 05/06/2020 03/16/2020 03/22/2019 02/15/2019  PHQ - 2 Score 0 0 0 0 0 0 0  PHQ- 9 Score - 0 - - - 0 0     Fall Risk Fall Risk  07/22/2020 07/15/2020 07/07/2020 07/07/2020 05/06/2020  Falls in the past year? 1 1 1  0 1  Number falls in past yr: 0 0 0 - 0  Injury with Fall? 1 1 1  - 1  Risk for fall due to : History of fall(s) Impaired balance/gait No Fall Risks - No Fall Risks  Follow up Falls evaluation completed Education provided Falls evaluation completed - Falls evaluation completed     Objective:  LAKELYNN SEVERTSON seemed alert and oriented and she participated appropriately during our telephone visit.  Blood Pressure Weight BMI  BP Readings from Last 3 Encounters:   07/15/20 138/78  07/07/20 140/83  05/06/20 (!) 151/76   Wt Readings from Last 3 Encounters:  07/15/20 182 lb (82.6 kg)  07/07/20 183 lb 9.6 oz (83.3 kg)  05/06/20 184 lb (83.5 kg)   BMI Readings from Last 1 Encounters:  07/15/20 32.24 kg/m    *Unable to obtain current vital signs, weight, and BMI due to telephone visit type  Hearing/Vision  . [place] did not seem to have difficulty with hearing/understanding during the telephone conversation . Reports that she has had a formal eye exam by an eye care professional within the past year . Reports that she has not had a formal hearing evaluation within the past year *Unable to fully assess hearing and vision during telephone visit type  Cognitive Function: 6CIT Screen 07/22/2020  What Year? 0 points  What month? 0 points  What time? 0 points  Count back from 20 0 points  Months in reverse 0 points  Repeat phrase 0 points  Total Score 0   (Normal:0-7, Significant for Dysfunction: >8)  Normal Cognitive Function Screening: Yes   Immunization & Health Maintenance Record Immunization History  Administered Date(s) Administered  . Fluad Quad(high Dose 65+) 02/15/2019, 03/16/2020  . Influenza Split 03/25/2013  . Influenza, High Dose Seasonal PF 03/10/2017  . Influenza, Seasonal, Injecte, Preservative Fre 02/21/2015, 03/02/2016  . Influenza-Unspecified 02/21/2015  . PFIZER(Purple Top)SARS-COV-2 Vaccination 06/12/2019, 07/03/2019, 04/01/2020  . Pneumococcal Polysaccharide-23 02/15/2018  . Tdap 07/15/2020    Health Maintenance  Topic Date Due  . DEXA SCAN  Never done  . MAMMOGRAM  02/13/2021  . COLONOSCOPY (Pts 45-88yrs Insurance coverage will need to be confirmed)  04/07/2022  . TETANUS/TDAP  07/15/2030  . INFLUENZA VACCINE  Completed  . COVID-19 Vaccine  Completed  . PNA vac Low Risk Adult  Completed  . HPV VACCINES  Aged Out  . Hepatitis C Screening  Discontinued       Assessment  This is a routine wellness  examination for Maria Briggs.  Health Maintenance: Due or Overdue Health Maintenance Due  Topic Date Due  . DEXA SCAN  Never done    Maria Briggs does not need a referral for Community Assistance: Care Management:   no Social Work:    no Prescription Assistance:  no Nutrition/Diabetes Education:  no   Plan:  Personalized Goals Goals Addressed            This Visit's Progress   . Patient Stated       07/22/2020 AWV Goal: Exercise for General Health   Patient will verbalize understanding of the benefits of increased physical activity:  Exercising regularly is important. It will improve your overall fitness, flexibility, and endurance.  Regular exercise also will improve your overall health. It can help you control your weight, reduce stress, and improve your bone density.  Over the next year, patient will increase physical activity as tolerated with a goal of at least 150 minutes of moderate physical activity per week.   You can tell that you are exercising at a moderate intensity if your heart starts beating faster and you start breathing faster but can still hold a conversation.  Moderate-intensity exercise ideas include:  Walking 1 mile (1.6 km) in about 15 minutes  Biking  Hiking  Golfing  Dancing  Water aerobics  Patient will verbalize understanding of everyday activities that increase physical activity by providing examples like the following: ? Yard work, such as: ? Pushing a Conservation officer, nature ? Raking and bagging leaves ? Washing your car ? Pushing a stroller ? Shoveling snow ? Gardening ? Washing windows or floors  Patient will be able to explain general safety guidelines for exercising:   Before you start a new exercise program, talk with your health care provider.  Do not exercise so much that you hurt yourself, feel dizzy, or get very short of breath.  Wear comfortable clothes and wear shoes with good support.  Drink plenty of water  while you exercise to prevent dehydration or heat stroke.  Work out until your breathing and your heartbeat get faster.       Personalized Health Maintenance & Screening Recommendations  Bone densitometry screening  Lung Cancer Screening Recommended: no (Low Dose CT Chest recommended if Age 61-80 years, 30 pack-year currently smoking OR have quit w/in past 15 years) Hepatitis C Screening recommended: no HIV Screening recommended: no  Advanced Directives: Written information was not prepared per patient's request.  Referrals & Orders No orders of the defined types were placed in this encounter.   Follow-up Plan . Follow-up with Maria Norlander, DO as planned   I have personally reviewed and noted the following in the patient's chart:   . Medical and social history . Use of alcohol, tobacco or illicit drugs  . Current medications and supplements . Functional ability and status . Nutritional status . Physical activity . Advanced directives . List of other physicians . Hospitalizations, surgeries, and ER visits in previous 12 months . Vitals . Screenings to include cognitive, depression, and falls . Referrals and appointments  In addition, I have reviewed and discussed with Maria Briggs certain preventive protocols, quality metrics, and best practice recommendations. A written personalized care plan for preventive services as well as general preventive health recommendations is available and can be mailed to the patient at her request.      Christia Reading, LPN  4/0/9811

## 2020-07-23 DIAGNOSIS — H25813 Combined forms of age-related cataract, bilateral: Secondary | ICD-10-CM | POA: Diagnosis not present

## 2020-07-23 DIAGNOSIS — H43812 Vitreous degeneration, left eye: Secondary | ICD-10-CM | POA: Diagnosis not present

## 2020-07-29 ENCOUNTER — Ambulatory Visit (INDEPENDENT_AMBULATORY_CARE_PROVIDER_SITE_OTHER): Payer: Medicare Other

## 2020-07-29 ENCOUNTER — Other Ambulatory Visit: Payer: Medicare Other

## 2020-07-29 ENCOUNTER — Other Ambulatory Visit: Payer: Self-pay

## 2020-07-29 DIAGNOSIS — Z780 Asymptomatic menopausal state: Secondary | ICD-10-CM

## 2020-07-31 DIAGNOSIS — Z780 Asymptomatic menopausal state: Secondary | ICD-10-CM | POA: Diagnosis not present

## 2020-08-19 DIAGNOSIS — H25813 Combined forms of age-related cataract, bilateral: Secondary | ICD-10-CM | POA: Diagnosis not present

## 2020-09-28 ENCOUNTER — Other Ambulatory Visit: Payer: Self-pay | Admitting: Family Medicine

## 2020-10-30 DIAGNOSIS — H25812 Combined forms of age-related cataract, left eye: Secondary | ICD-10-CM | POA: Diagnosis not present

## 2020-11-13 ENCOUNTER — Ambulatory Visit (INDEPENDENT_AMBULATORY_CARE_PROVIDER_SITE_OTHER): Payer: Medicare Other | Admitting: Family Medicine

## 2020-11-13 ENCOUNTER — Encounter: Payer: Self-pay | Admitting: Family Medicine

## 2020-11-13 DIAGNOSIS — U071 COVID-19: Secondary | ICD-10-CM | POA: Diagnosis not present

## 2020-11-13 MED ORDER — DEXAMETHASONE 6 MG PO TABS: 6.0000 mg | ORAL_TABLET | Freq: Two times a day (BID) | ORAL | 0 refills | Status: DC

## 2020-11-13 MED ORDER — MOLNUPIRAVIR EUA 200MG CAPSULE: 4.0000 | ORAL_CAPSULE | Freq: Two times a day (BID) | ORAL | 0 refills | Status: AC

## 2020-11-13 NOTE — Progress Notes (Signed)
Subjective:    Patient ID: Maria Briggs, female    DOB: 1951-01-25, 70 y.o.   MRN: [id number]   HPI: Maria Briggs is a 70 y.o. female presenting for onset last night of cold sx. Head doesn't feel right. Had chills over night. Diarrhea 7-8 times yesterday. Cough and fever to 100.5. Aching all over. Home CoVID test was positive.   Depression screen [facility] 2/9 07/22/2020 07/15/2020 07/07/2020 05/06/2020 03/16/2020  Decreased Interest 0 0 0 0 0  Down, Depressed, Hopeless 0 0 0 0 0  PHQ - 2 Score 0 0 0 0 0  Altered sleeping - 0 - - -  Tired, decreased energy - 0 - - -  Change in appetite - 0 - - -  Feeling bad or failure about yourself  - 0 - - -  Trouble concentrating - 0 - - -  Moving slowly or fidgety/restless - 0 - - -  Suicidal thoughts - 0 - - -  PHQ-9 Score - 0 - - -  Difficult doing work/chores - Not difficult at all - - -     Relevant past medical, surgical, family and social history reviewed and updated as indicated.  Interim medical history since our last visit reviewed. Allergies and medications reviewed and updated.  ROS:  Review of Systems  Constitutional:  Positive for fatigue and fever.  HENT: Negative.  Negative for congestion.   Eyes:  Negative for visual disturbance.  Respiratory:  Positive for cough. Negative for shortness of breath.   Cardiovascular:  Negative for chest pain.  Gastrointestinal:  Negative for abdominal pain.  Musculoskeletal:  Positive for myalgias.    Social History   Tobacco Use  Smoking Status Never  Smokeless Tobacco Never       Objective:     Wt Readings from Last 3 Encounters:  07/15/20 182 lb (82.6 kg)  07/07/20 183 lb 9.6 oz (83.3 kg)  05/06/20 184 lb (83.5 kg)     Exam deferred. Pt. Harboring due to COVID 19. Phone visit performed.   Assessment & Plan:   1. COVID-19 virus infection     Meds ordered this encounter  Medications   molnupiravir EUA 200 mg CAPS    Sig: Take 4 capsules (800 mg total) by mouth  2 (two) times daily for 5 days.    Dispense:  40 capsule    Refill:  0    No orders of the defined types were placed in this encounter.     Diagnoses and all orders for this visit:  COVID-19 virus infection  Other orders -     molnupiravir EUA 200 mg CAPS; Take 4 capsules (800 mg total) by mouth 2 (two) times daily for 5 days.   Virtual Visit via telephone Note  I discussed the limitations, risks, security and privacy concerns of performing an evaluation and management service by telephone and the availability of in person appointments. The patient was identified with two identifiers. Pt.expressed understanding and agreed to proceed. Pt. Is at home. Dr. Livia Snellen is in his office.  Follow Up Instructions:   I discussed the assessment and treatment plan with the patient. The patient was provided an opportunity to ask questions and all were answered. The patient agreed with the plan and demonstrated an understanding of the instructions.   The patient was advised to call back or seek an in-person evaluation if the symptoms worsen or if the condition fails to improve as anticipated.   Total minutes including  chart review and phone contact time: 12   Follow up plan: No follow-ups on file.  Claretta Fraise, MD [facility]

## 2020-12-01 DIAGNOSIS — H25811 Combined forms of age-related cataract, right eye: Secondary | ICD-10-CM | POA: Diagnosis not present

## 2020-12-02 ENCOUNTER — Other Ambulatory Visit: Payer: Self-pay | Admitting: Family Medicine

## 2020-12-26 ENCOUNTER — Other Ambulatory Visit: Payer: Self-pay | Admitting: Family Medicine

## 2020-12-26 DIAGNOSIS — I10 Essential (primary) hypertension: Secondary | ICD-10-CM

## 2021-01-13 ENCOUNTER — Ambulatory Visit (INDEPENDENT_AMBULATORY_CARE_PROVIDER_SITE_OTHER): Payer: Medicare Other | Admitting: Family Medicine

## 2021-01-13 ENCOUNTER — Encounter: Payer: Self-pay | Admitting: Family Medicine

## 2021-01-13 ENCOUNTER — Other Ambulatory Visit: Payer: Self-pay

## 2021-01-13 VITALS — BP 156/85 | HR 95 | Temp 97.4°F | Ht 63.0 in | Wt 185.2 lb

## 2021-01-13 DIAGNOSIS — G720 Drug-induced myopathy: Secondary | ICD-10-CM | POA: Diagnosis not present

## 2021-01-13 DIAGNOSIS — I10 Essential (primary) hypertension: Secondary | ICD-10-CM | POA: Diagnosis not present

## 2021-01-13 DIAGNOSIS — H9313 Tinnitus, bilateral: Secondary | ICD-10-CM

## 2021-01-13 DIAGNOSIS — K5909 Other constipation: Secondary | ICD-10-CM

## 2021-01-13 DIAGNOSIS — Z0000 Encounter for general adult medical examination without abnormal findings: Secondary | ICD-10-CM

## 2021-01-13 DIAGNOSIS — E7800 Pure hypercholesterolemia, unspecified: Secondary | ICD-10-CM

## 2021-01-13 DIAGNOSIS — Z0001 Encounter for general adult medical examination with abnormal findings: Secondary | ICD-10-CM | POA: Diagnosis not present

## 2021-01-13 LAB — CMP14+EGFR
ALT: 9 IU/L (ref 0–32)
AST: 9 IU/L (ref 0–40)
Albumin/Globulin Ratio: 1.7 (ref 1.2–2.2)
Albumin: 4.4 g/dL (ref 3.8–4.8)
Alkaline Phosphatase: 85 IU/L (ref 44–121)
BUN/Creatinine Ratio: 22 (ref 12–28)
BUN: 18 mg/dL (ref 8–27)
Bilirubin Total: 1 mg/dL (ref 0.0–1.2)
CO2: 23 mmol/L (ref 20–29)
Calcium: 9.4 mg/dL (ref 8.7–10.3)
Chloride: 101 mmol/L (ref 96–106)
Creatinine, Ser: 0.81 mg/dL (ref 0.57–1.00)
Globulin, Total: 2.6 g/dL (ref 1.5–4.5)
Glucose: 101 mg/dL — ABNORMAL HIGH (ref 65–99)
Potassium: 4.4 mmol/L (ref 3.5–5.2)
Sodium: 141 mmol/L (ref 134–144)
Total Protein: 7 g/dL (ref 6.0–8.5)
eGFR: 79 mL/min/{1.73_m2} (ref >59)

## 2021-01-13 LAB — LIPID PANEL
Chol/HDL Ratio: 2.9 ratio (ref 0.0–4.4)
Cholesterol, Total: 220 mg/dL — ABNORMAL HIGH (ref 100–199)
HDL: 77 mg/dL (ref >39)
LDL Chol Calc (NIH): 127 mg/dL — ABNORMAL HIGH (ref 0–99)
Triglycerides: 91 mg/dL (ref 0–149)
VLDL Cholesterol Cal: 16 mg/dL (ref 5–40)

## 2021-01-13 MED ORDER — NEXLETOL 180 MG PO TABS: 1.0000 | ORAL_TABLET | Freq: Every day | ORAL | 0 refills | Status: DC

## 2021-01-13 NOTE — Patient Instructions (Signed)
Ok to use 1 cap of Miralax in 8 ounce if fluid daily for constipation  Goal BP <150/90  Tinnitus Tinnitus refers to hearing a sound when there is no actual source for that sound. This is often described as ringing in the ears. However, people withthis condition may hear a variety of noises, in one ear or in both ears. The sounds of tinnitus can be soft, loud, or somewhere in between. Tinnitus can last for a few seconds or can be constant for days. It may go away without treatment and come back at various times. When tinnitus is constant or happens often, it can lead to other problems, such as trouble sleeping and troubleconcentrating. Almost everyone experiences tinnitus at some point. Tinnitus is not the same as hearing loss. Tinnitus that is long-lasting (chronic) or comes back often (recurs) may require medical attention. What are the causes? The cause of tinnitus is often not known. In some cases, it can result from: Exposure to loud noises from machinery, music, or other sources. An object (foreign body) stuck in the ear. Earwax buildup. Drinking alcohol or caffeine. Taking certain medicines. Age-related hearing loss. It may also be caused by medical conditions such as: Ear or sinus infections. Heart diseases or high blood pressure. Allergies. Mnire's disease. Thyroid problems. Tumors. A weak, bulging blood vessel (aneurysm) near the ear. What increases the risk? The following factors may make you more likely to develop this condition: Exposure to loud noises. Age. Tinnitus is more likely in older individuals. Using alcohol or tobacco. What are the signs or symptoms? The main symptom of tinnitus is hearing a sound when there is no source for that sound. It may sound like: Buzzing. Sizzling. Ringing. Blowing air. Hissing. Whistling. Other sounds may include: Roaring. Running water. A musical note. Tapping. Humming. Symptoms may affect only one ear (unilateral) or both  ears (bilateral). How is this diagnosed? Tinnitus is diagnosed based on your symptoms, your medical history, and a physical exam. Your health care provider may do a thorough hearing test (audiologic exam) if your tinnitus: Is unilateral. Causes hearing difficulties. Lasts 6 months or longer. You may work with a health care provider who specializes in hearing disorders (audiologist). You may be asked questions about your symptoms and how they affect your daily life. You may have other tests done, such as: CT scan. MRI. An imaging test of how blood flows through your blood vessels (angiogram). How is this treated? Treating an underlying medical condition can sometimes make tinnitus go away. If your tinnitus continues, other treatments may include: Therapy and counseling to help you manage the stress of living with tinnitus. Sound generators to mask the tinnitus. These include: Tabletop sound machines that play relaxing sounds to help you fall asleep. Wearable devices that fit in your ear and play sounds or music. Acoustic neural stimulation. This involves using headphones to listen to music that contains an auditory signal. Over time, listening to this signal may change some pathways in your brain and make you less sensitive to tinnitus. This treatment is used for very severe cases when no other treatment is working. Using hearing aids or cochlear implants if your tinnitus is related to hearing loss. Hearing aids are worn in the outer ear. Cochlear implants are surgically placed in the inner ear. Follow these instructions at home: Managing symptoms     When possible, avoid being in loud places and being exposed to loud sounds. Wear hearing protection, such as earplugs, when you are exposed to  loud noises. Use a white noise machine, a humidifier, or other devices to mask the sound of tinnitus. Practice techniques for reducing stress, such as meditation, yoga, or deep breathing. Work with your  health care provider if you need help with managing stress. Sleep with your head slightly raised. This may reduce the impact of tinnitus. General instructions Do not use stimulants, such as nicotine, alcohol, or caffeine. Talk with your health care provider about other stimulants to avoid. Stimulants are substances that can make you feel alert and attentive by increasing certain activities in the body (such as heart rate and blood pressure). These substances may make tinnitus worse. Take over-the-counter and prescription medicines only as told by your health care provider. Try to get plenty of sleep each night. Keep all follow-up visits. This is important. Contact a health care provider if: Your tinnitus continues for 3 weeks or longer without stopping. You develop sudden hearing loss. Your symptoms get worse or do not get better with home care. You feel you are not able to manage the stress of living with tinnitus. Get help right away if: You develop tinnitus after a head injury. You have tinnitus along with any of the following: Dizziness. Nausea and vomiting. Loss of balance. Sudden, severe headache. Vision changes. Facial weakness or weakness of arms or legs. These symptoms may represent a serious problem that is an emergency. Do not wait to see if the symptoms will go away. Get medical help right away. Call your local emergency services (911 in the [place] Do not drive yourself to the hospital. Summary Tinnitus refers to hearing a sound when there is no actual source for that sound. This is often described as ringing in the ears. Symptoms may affect only one ear (unilateral) or both ears (bilateral). Use a white noise machine, a humidifier, or other devices to mask the sound of tinnitus. Do not use stimulants, such as nicotine, alcohol, or caffeine. These substances may make tinnitus worse. This information is not intended to replace advice given to you by your health care provider.  Make sure you discuss any questions you have with your healthcare provider. Document Revised: 04/13/2020 Document Reviewed: 04/13/2020 Elsevier Patient Education  2022 [employer].

## 2021-01-13 NOTE — Progress Notes (Signed)
Maria Briggs is a 70 y.o. female presents to office today for annual physical exam examination.    Concerns today include: 1. HTN, HLD Compliant with her Norvasc, Zetia.  She is having no issues with use of Zetia.  Denies any chest pain, shortness of breath or edema.  She is considering starting "keto blast", is a supplement that she saw advertised by [employer].  She is interested in losing weight but does not want to go on a keto diet as she is done that before and is extremely hard.  She reports fluctuating blood pressures such that she has blood pressures at home anywhere in the 888K 800L systolic over 49Z diastolic.  However, when she goes to specialist office it always seems to be elevated  2.  Tinnitus Patient reports bilateral tinnitus.  This is constant.  She had history of some hearing loss last hearing exam was over 4 years ago when she was working.  Denies any associated dizziness, drainage.  3.  Constipation Patient reports that she has been occasionally using MiraLAX for constipation with some good output.  Denies any blood in stool, nausea or vomiting.  Occupation: retired  Diet: fair, Exercise: silver sneakers several times per week Last eye exam: Up-to-date; this was cataract surgery Last dental exam: Up-to-date Last colonoscopy: Up-to-date Last mammogram: Up-to-date Last pap smear: N/A Refills needed today: None Immunizations needed: Immunization History  Administered Date(s) Administered   Fluad Quad(high Dose 65+) 02/15/2019, 03/16/2020   Influenza Split 03/25/2013   Influenza, High Dose Seasonal PF 03/10/2017   Influenza, Seasonal, Injecte, Preservative Fre 02/21/2015, 03/02/2016   Influenza-Unspecified 02/21/2015   PFIZER(Purple Top)SARS-COV-2 Vaccination 06/12/2019, 07/03/2019, 04/01/2020   Pneumococcal Polysaccharide-23 02/15/2018   Tdap 07/15/2020     Past Medical History:  Diagnosis Date   Arthritis    "maybe a little; comes & goes; various  parts of my body" (02/20/2015)   Hyperlipidemia    PONV (postoperative nausea and vomiting)    Social History   Socioeconomic History   Marital status: Married    Spouse name: Coralyn Mark   Number of children: 2   Years of education: Not on file   Highest education level: High school graduate  Occupational History   Occupation: retired  Tobacco Use   Smoking status: Never   Smokeless tobacco: Never  Vaping Use   Vaping Use: Never used  Substance and Sexual Activity   Alcohol use: Not Currently    Comment: 02/20/2015 "might have a drink q 6 months"   Drug use: No   Sexual activity: Not Currently  Other Topics Concern   Not on file  Social History Narrative   Retired, lives with husband Coralyn Mark, 2 children, enjoys reading and puzzles   Social Determinants of Health   Financial Resource Strain: Not on file  Food Insecurity: Not on file  Transportation Needs: Not on file  Physical Activity: Not on file  Stress: Not on file  Social Connections: Not on file  Intimate Partner Violence: Not on file   Past Surgical History:  Procedure Laterality Date   [place]  ~ 2005   [place]  2006?   HERNIA REPAIR     INCISIONAL HERNIA REPAIR N/A 02/20/2015   Procedure: LAPAROSCOPIC INCISIONAL HERNIA REPAIR WITH MESH;  Surgeon: Coralie Keens, MD;  Location: [facility];  Service: General;  Laterality: N/A;   INSERTION OF MESH N/A 02/20/2015   Procedure: INSERTION OF MESH;  Surgeon: Coralie Keens, MD;  Location: [facility];  Service: General;  Laterality: N/A;   LAPAROSCOPIC INCISIONAL / UMBILICAL / [place]  02/20/2015   incisional w/mesh   LUMBAR [place]  2000's   "off my back; no cancer"   [place]   Family History  Problem Relation Age of Onset   Colon polyps Father    Colon cancer Father         ?   Prostate cancer Father    Dementia Father    COPD Father    Anxiety disorder Mother    COPD Mother    Depression Mother    Heart attack Brother 44   Mood Disorder Son    Stroke Paternal Grandmother    Diabetes Paternal Grandmother    Stroke Paternal Grandfather    Esophageal cancer Neg Hx    Stomach cancer Neg Hx    Rectal cancer Neg Hx     Current Outpatient Medications:    amLODipine (NORVASC) 5 MG tablet, TAKE 1 TABLET BY MOUTH EVERY DAY, Disp: 90 tablet, Rfl: 0   aspirin 81 MG tablet, Take 81 mg by mouth daily., Disp: , Rfl:    Biotin 5 MG CAPS, Take 1 capsule by mouth daily. , Disp: , Rfl:    CALCIUM PO, Take 600 mg by mouth daily. , Disp: , Rfl:    estradiol (ESTRACE) 0.1 MG/GM vaginal cream, Place 1 Applicatorful vaginally every other day., Disp: , Rfl:    ezetimibe (ZETIA) 10 MG tablet, TAKE 1 TABLET BY MOUTH EVERY DAY, Disp: 30 tablet, Rfl: 1   ibuprofen (ADVIL) 800 MG tablet, Take 800 mg by mouth 3 (three) times daily. (Patient not taking: No sig reported), Disp: , Rfl:    meloxicam (MOBIC) 15 MG tablet, Take 0.5-1 tablets (7.5-15 mg total) by mouth daily., Disp: 30 tablet, Rfl: 2   minoxidil (ROGAINE) 2 % external solution, Apply topically 2 (two) times daily., Disp: , Rfl:    Omega-3 Fatty Acids (FISH OIL PO), Take 1,000 mg by mouth 2 (two) times daily., Disp: , Rfl:   Allergies  Allergen Reactions   Penicillins Hives   Rosuvastatin Calcium Other (See Comments)    Muscle pain, weakness in limbs   Statins Other (See Comments)    Muscle pain, weakness in limbs     ROS: Review of Systems Pertinent items noted in HPI and remainder of comprehensive ROS otherwise negative.    Physical exam BP (!) 156/85   Pulse 95   Temp (!) 97.4 F (36.3 C)   Ht _0  (1.6 m)   Wt 185 lb 3.2 oz (84 kg)   SpO2 97%   BMI 32.81 kg/m  General appearance: alert, cooperative, appears stated age, and no distress Head: Normocephalic, without obvious abnormality,  atraumatic Eyes: negative findings: lids and lashes normal, conjunctivae and sclerae normal, corneas clear, and pupils equal, round, reactive to light and accomodation Ears: normal TM's and external ear canals both ears Nose: Nares normal. Septum midline. Mucosa normal. No drainage or sinus tenderness. Throat: lips, mucosa, and tongue normal; teeth and gums normal Neck: no adenopathy, no carotid bruit, supple, symmetrical, trachea midline, and thyroid not enlarged, symmetric, no tenderness/mass/nodules Back: symmetric, no curvature. ROM normal. No CVA tenderness. Lungs: clear to auscultation bilaterally Heart: regular rate and rhythm, S1, S2  normal, no murmur, click, rub or gallop Abdomen: soft, non-tender; bowel sounds normal; no masses,  no organomegaly Extremities: extremities normal, atraumatic, no cyanosis or edema Pulses: 2+ and symmetric Skin: Skin color, texture, turgor normal. No rashes or lesions Lymph nodes: Cervical, supraclavicular, and axillary nodes normal. Neurologic: Alert and oriented X 3, normal strength and tone. Normal symmetric reflexes. Normal coordination and gait Psych: Mood stable, speech normal   Assessment/ Plan: [name] here for annual physical exam.   Annual physical exam  Essential hypertension - Plan: Lipid Panel, CMP14+EGFR  Pure hypercholesterolemia - Plan: Lipid Panel, CMP14+EGFR, Bempedoic Acid (NEXLETOL) 180 MG TABS  Drug-induced myopathy  Tinnitus aurium, bilateral - Plan: Ambulatory referral to Audiology  Chronic constipation  Blood pressure is not at goal.  I have asked that she monitor her blood pressures closely for the next few weeks.  A log was provided to her today.  We will plan to reassess and if persistently elevated will plan advance blood pressure medication to 10 mg daily.  Fasting lipid panel, CMP ordered.  History of drug-induced myopathy with statin.  She has been on Zetia but LDL still has not been at goal.  I have  given her a couple of weeks of Nexletol to try out.  We will combine this as nexlizet if she tolerates.  Having some tinnitus.  Possibly due to hearing loss.  Referral to audiology placed  We discussed okay to continue using 1 capful of MiraLAX mixed in 8 ounces of fluid and drinking daily for constipation if needed  Counseled on healthy lifestyle choices, including diet (rich in fruits, vegetables and lean meats and low in salt and simple carbohydrates) and exercise (at least 30 minutes of moderate physical activity daily).  Patient to follow up in 1 year for annual exam or sooner if needed.  Christen Bedoya M. Lajuana Ripple, DO

## 2021-01-18 ENCOUNTER — Encounter: Payer: Self-pay | Admitting: Family Medicine

## 2021-01-18 ENCOUNTER — Other Ambulatory Visit: Payer: Self-pay

## 2021-01-18 ENCOUNTER — Ambulatory Visit (INDEPENDENT_AMBULATORY_CARE_PROVIDER_SITE_OTHER): Payer: Medicare Other | Admitting: Family Medicine

## 2021-01-18 VITALS — BP 147/86 | HR 97 | Temp 97.5°F | Ht 63.0 in | Wt 186.6 lb

## 2021-01-18 DIAGNOSIS — K5790 Diverticulosis of intestine, part unspecified, without perforation or abscess without bleeding: Secondary | ICD-10-CM

## 2021-01-18 DIAGNOSIS — R1032 Left lower quadrant pain: Secondary | ICD-10-CM

## 2021-01-18 MED ORDER — METRONIDAZOLE 500 MG PO TABS: 500.0000 mg | ORAL_TABLET | Freq: Three times a day (TID) | ORAL | 0 refills | Status: AC

## 2021-01-18 MED ORDER — CIPROFLOXACIN HCL 500 MG PO TABS: 500.0000 mg | ORAL_TABLET | Freq: Two times a day (BID) | ORAL | 0 refills | Status: AC

## 2021-01-18 NOTE — Progress Notes (Signed)
Subjective: CC: LLQ pain PCP: Maria Norlander, DO PF:[id number] P Maria Briggs is a 70 y.o. female presenting to clinic today for:  1. LLQ pain Known diverticulosis in the entire colon on 2018 colonoscopy.  She denies any diarrhea or constipation stools.  No blood in stool.  No nausea, vomiting.  She had a small elevation in her temperature on Saturday to 99.1 F that has not recurred since.  She is been using MiraLAX but this is not resolved her issue.  She think she has diverticulitis as this feels like the episode she had previously when she used to see Dr. Edrick Oh.  ROS: Per HPI  Allergies  Allergen Reactions   Penicillins Hives   Rosuvastatin Calcium Other (See Comments)    Muscle pain, weakness in limbs   Statins Other (See Comments)    Muscle pain, weakness in limbs   Past Medical History:  Diagnosis Date   Arthritis    "maybe a little; comes & goes; various parts of my body" (02/20/2015)   Hyperlipidemia    PONV (postoperative nausea and vomiting)     Current Outpatient Medications:    amLODipine (NORVASC) 5 MG tablet, TAKE 1 TABLET BY MOUTH EVERY DAY, Disp: 90 tablet, Rfl: 0   aspirin 81 MG tablet, Take 81 mg by mouth daily., Disp: , Rfl:    Bempedoic Acid (NEXLETOL) 180 MG TABS, Take 1 tablet by mouth daily., Disp: 14 tablet, Rfl: 0   Biotin 5 MG CAPS, Take 1 capsule by mouth daily. , Disp: , Rfl:    CALCIUM PO, Take 600 mg by mouth daily. , Disp: , Rfl:    estradiol (ESTRACE) 0.1 MG/GM vaginal cream, Place 1 Applicatorful vaginally every other day., Disp: , Rfl:    ezetimibe (ZETIA) 10 MG tablet, TAKE 1 TABLET BY MOUTH EVERY DAY, Disp: 30 tablet, Rfl: 1   ibuprofen (ADVIL) 800 MG tablet, Take 800 mg by mouth 3 (three) times daily. (Patient not taking: No sig reported), Disp: , Rfl:    meloxicam (MOBIC) 15 MG tablet, Take 0.5-1 tablets (7.5-15 mg total) by mouth daily., Disp: 30 tablet, Rfl: 2   Omega-3 Fatty Acids (FISH OIL PO), Take 1,000 mg by mouth 2 (two) times  daily., Disp: , Rfl:  Social History   Socioeconomic History   Marital status: Married    Spouse name: Maria Briggs   Number of children: 2   Years of education: Not on file   Highest education level: High school graduate  Occupational History   Occupation: retired  Tobacco Use   Smoking status: Never   Smokeless tobacco: Never  Vaping Use   Vaping Use: Never used  Substance and Sexual Activity   Alcohol use: Not Currently    Comment: 02/20/2015 "might have a drink q 6 months"   Drug use: No   Sexual activity: Not Currently  Other Topics Concern   Not on file  Social History Narrative   Retired, lives with husband Maria Briggs, 2 children, enjoys reading and puzzles   Social Determinants of Radio broadcast assistant Strain: Not on file  Food Insecurity: Not on file  Transportation Needs: Not on file  Physical Activity: Not on file  Stress: Not on file  Social Connections: Not on file  Intimate Partner Violence: Not on file   Family History  Problem Relation Age of Onset   Colon polyps Father    Colon cancer Father        ?   Prostate cancer Father  Dementia Father    COPD Father    Anxiety disorder Mother    COPD Mother    Depression Mother    Heart attack Brother 22   Mood Disorder Son    Stroke Paternal Grandmother    Diabetes Paternal Grandmother    Stroke Paternal Grandfather    Esophageal cancer Neg Hx    Stomach cancer Neg Hx    Rectal cancer Neg Hx     Objective: Office vital signs reviewed. BP (!) 147/86   Pulse 97   Temp (!) 97.5 F (36.4 C)   Ht '5\' 3"'$  (1.6 m)   Wt 186 lb 9.6 oz (84.6 kg)   SpO2 98%   BMI 33.05 kg/m   Physical Examination:  General: Awake, alert, obese, nontoxic, No acute distress HEENT: Normal, sclera white GI: obese, soft, MILD TTP to LLW. non-distended, bowel sounds present x4, no hepatomegaly, no splenomegaly, no masses  Assessment/ Plan: 70 y.o. female   LLQ pain - Plan: DG Abd 1 View, ciprofloxacin (CIPRO) 500 MG  tablet, metroNIDAZOLE (FLAGYL) 500 MG tablet  Diverticulosis - Plan: DG Abd 1 View, ciprofloxacin (CIPRO) 500 MG tablet, metroNIDAZOLE (FLAGYL) 500 MG tablet  I discussed with the patient the recommendations for diverticulitis treatment including imaging to confirm and rule out any other concerning features such as abscess, perforation or microperforation.  She however declined these.  We also discussed that antibiotics are not typically used in mild cases that are not involving patient hospitalization.  She wished to proceed with antibiotics anyway.  Of note, her physical exam is notable for mild left lower quadrant tenderness.  She had no peritoneal symptoms or signs to suggest acute abdomen.  She was well-appearing and nontoxic on examination.  No orders of the defined types were placed in this encounter.  No orders of the defined types were placed in this encounter.    Maria Norlander, DO [place] [phone]

## 2021-01-18 NOTE — Patient Instructions (Addendum)
We discussed standards of care and guidelines for diagnosis of diverticulitis which include CAT scan of the pelvis.  This is to determine need for antibiotics as the current recommendation is not to utilize antibiotics in mild cases, which is what you sound like you might have.  However you declined both of these and we are going to proceed with Cipro Flagyl for treatment.  We discussed red flag signs and symptoms which would warrant further evaluation in the emergency department.  We discussed the risk of perforation, abscess, sepsis, possible inaccurate diagnosis and death without these tests.  You accepted these risks and declined outpatient imaging and standard of care.   Diverticulitis  Diverticulitis is when small pouches in your colon (large intestine) get infected or swollen. This causes pain in the belly (abdomen) and watery poop (diarrhea). These pouches are called diverticula. The pouches form in people who have acondition called diverticulosis. What are the causes? This condition may be caused by poop (stool) that gets trapped in the pouches in your colon. The poop lets germs (bacteria) grow in the pouches. This causes the infection. What increases the risk? You are more likely to get this condition if you have small pouches in your colon. The risk is higher if: You are overweight or very overweight (obese). You do not exercise enough. You drink alcohol. You smoke or use products with tobacco in them. You eat a diet that has a lot of red meat such as beef, pork, or lamb. You eat a diet that does not have enough fiber in it. You are older than 69 years of age. What are the signs or symptoms? Pain in the belly. Pain is often on the left side, but it may be in other areas. Fever and feeling cold. Feeling like you may vomit. Vomiting. Having cramps. Feeling full. Changes to how often you poop. Blood in your poop. How is this treated? Most cases are treated at home by: Taking  over-the-counter pain medicines. Following a clear liquid diet. Taking antibiotic medicines. Resting. Very bad cases may need to be treated at a hospital. This may include: Not eating or drinking. Taking prescription pain medicine. Getting antibiotic medicines through an IV tube. Getting fluid and food through an IV tube. Having surgery. When you are feeling better, your doctor may tell you to have a test to check your colon (colonoscopy). Follow these instructions at home: Medicines Take over-the-counter and prescription medicines only as told by your doctor. These include: Antibiotics. Pain medicines. Fiber pills. Probiotics. Stool softeners. If you were prescribed an antibiotic medicine, take it as told by your doctor. Do not stop taking the antibiotic even if you start to feel better. Ask your doctor if the medicine prescribed to you requires you to avoid driving or using machinery. Eating and drinking  Follow a diet as told by your doctor. When you feel better, your doctor may tell you to change your diet. You may need to eat a lot of fiber. Fiber makes it easier to poop (have a bowel movement). Foods with fiber include: Berries. Beans. Lentils. Green vegetables. Avoid eating red meat.  General instructions Do not use any products that contain nicotine or tobacco, such as cigarettes, e-cigarettes, and chewing tobacco. If you need help quitting, ask your doctor. Exercise 3 or more times a week. Try to get 30 minutes each time. Exercise enough to sweat and make your heart beat faster. Keep all follow-up visits as told by your doctor. This is important. Contact  a doctor if: Your pain does not get better. You are not pooping like normal. Get help right away if: Your pain gets worse. Your symptoms do not get better. Your symptoms get worse very fast. You have a fever. You vomit more than one time. You have poop that is: Bloody. Black. Tarry. Summary This condition  happens when small pouches in your colon get infected or swollen. Take medicines only as told by your doctor. Follow a diet as told by your doctor. Keep all follow-up visits as told by your doctor. This is important. This information is not intended to replace advice given to you by your health care provider. Make sure you discuss any questions you have with your healthcare provider. Document Revised: 02/18/2019 Document Reviewed: 02/18/2019 Elsevier Patient Education  2022 [employer].

## 2021-01-21 ENCOUNTER — Encounter: Payer: Self-pay | Admitting: Family Medicine

## 2021-01-22 ENCOUNTER — Other Ambulatory Visit: Payer: Self-pay | Admitting: Family Medicine

## 2021-01-22 DIAGNOSIS — R1032 Left lower quadrant pain: Secondary | ICD-10-CM

## 2021-01-22 NOTE — Telephone Encounter (Signed)
CT scan ordered

## 2021-01-27 ENCOUNTER — Ambulatory Visit (INDEPENDENT_AMBULATORY_CARE_PROVIDER_SITE_OTHER): Payer: Medicare Other | Admitting: Family Medicine

## 2021-01-27 ENCOUNTER — Other Ambulatory Visit: Payer: Self-pay

## 2021-01-27 DIAGNOSIS — I10 Essential (primary) hypertension: Secondary | ICD-10-CM

## 2021-01-27 NOTE — Progress Notes (Signed)
Patient her today for  2 week BP recheck from Dr. Lajuana Ripple. Patient first reading was in her right arm 149/87 HR 87. Second reading was 140/80 HR 92. Patient state it just goes up when she is here. She has also brought readings from home I will place on your desk

## 2021-01-27 NOTE — Progress Notes (Signed)
BP acceptable.  Maria Briggs [name], [name]

## 2021-02-03 ENCOUNTER — Ambulatory Visit (HOSPITAL_COMMUNITY)
Admission: RE | Admit: 2021-02-03 | Discharge: 2021-02-03 | Disposition: A | Discharge status: Home / Self Care | Payer: Medicare Other | Source: Ambulatory Visit | Attending: Family Medicine | Admitting: Family Medicine

## 2021-02-03 ENCOUNTER — Other Ambulatory Visit: Payer: Self-pay

## 2021-02-03 ENCOUNTER — Ambulatory Visit: Payer: Medicare Other | Admitting: Audiologist

## 2021-02-03 DIAGNOSIS — R1032 Left lower quadrant pain: Secondary | ICD-10-CM | POA: Diagnosis not present

## 2021-02-03 DIAGNOSIS — N1330 Unspecified hydronephrosis: Secondary | ICD-10-CM | POA: Diagnosis not present

## 2021-02-03 DIAGNOSIS — I700 Atherosclerosis of aorta: Secondary | ICD-10-CM | POA: Diagnosis not present

## 2021-02-03 DIAGNOSIS — K5730 Diverticulosis of large intestine without perforation or abscess without bleeding: Secondary | ICD-10-CM | POA: Diagnosis not present

## 2021-02-03 DIAGNOSIS — N200 Calculus of kidney: Secondary | ICD-10-CM | POA: Diagnosis not present

## 2021-02-03 NOTE — Progress Notes (Signed)
Please call back about CT results

## 2021-02-03 NOTE — Progress Notes (Signed)
R/C about CT Scan results

## 2021-02-04 ENCOUNTER — Telehealth: Payer: Self-pay | Admitting: Family Medicine

## 2021-02-04 NOTE — Telephone Encounter (Signed)
Patient aware of results and she is seeing urology in October.

## 2021-02-04 NOTE — Telephone Encounter (Signed)
Pt returning call about CT results. Please call back.

## 2021-02-16 ENCOUNTER — Ambulatory Visit (INDEPENDENT_AMBULATORY_CARE_PROVIDER_SITE_OTHER): Payer: Medicare Other | Admitting: Pharmacist

## 2021-02-16 DIAGNOSIS — E7800 Pure hypercholesterolemia, unspecified: Secondary | ICD-10-CM | POA: Diagnosis not present

## 2021-02-16 DIAGNOSIS — I700 Atherosclerosis of aorta: Secondary | ICD-10-CM

## 2021-02-16 MED ORDER — NEXLIZET 180-10 MG PO TABS
1.0000 | ORAL_TABLET | Freq: Every day | ORAL | 6 refills | Status: DC
Start: 2021-02-16 — End: 2021-03-24

## 2021-02-16 NOTE — Progress Notes (Signed)
      02/16/21 Name: Maria Briggs         MRN: [id number]       DOB: 01/30/1951  HPI:  Maria Briggs is a 70 y.o. female patient referred to lipid clinic by PCP. PMH is significant for Past Medical History:  Diagnosis Date   Arthritis    "maybe a little; comes & goes; various parts of my body" (02/20/2015)   Hyperlipidemia    PONV (postoperative nausea and vomiting)     Current Medications: amlodipine, asa 81, biotin, calcium, estadiol cream, meloxicam, minoxidil topical, omega3 (fish oil)   Intolerances: patient has tried and failed numerous statins,  Her rheumatologist recommends that she does not retry statin therapy   Risk Factors:  HTN   LDL goal: <100    Diet: encourage low fat, heart healthy diet; increased fiber   Exercise: n/a   Family History: n/a   Social History: does not smoke or drink alcohol    Labs: Lipid Panel     Component Value Date/Time   CHOL 220 (H) 01/13/2021 1022   TRIG 91 01/13/2021 1022   HDL 77 01/13/2021 1022   CHOLHDL 2.9 01/13/2021 1022   LDLCALC 127 (H) 01/13/2021 1022   LABVLDL 16 01/13/2021 1022    Current Outpatient Medications on File Prior to Visit  Medication Sig Dispense Refill   amLODipine (NORVASC) 5 MG tablet TAKE 1 TABLET BY MOUTH EVERY DAY 90 tablet 0   aspirin 81 MG tablet Take 81 mg by mouth daily.     Bempedoic Acid (NEXLETOL) 180 MG TABS Take 1 tablet by mouth daily. 14 tablet 0   Biotin 5 MG CAPS Take 1 capsule by mouth daily.      CALCIUM PO Take 600 mg by mouth daily.      estradiol (ESTRACE) 0.1 MG/GM vaginal cream Place 1 Applicatorful vaginally every other day.     ezetimibe (ZETIA) 10 MG tablet TAKE 1 TABLET BY MOUTH EVERY DAY 30 tablet 1   ibuprofen (ADVIL) 800 MG tablet Take 800 mg by mouth 3 (three) times daily. (Patient not taking: No sig reported)     meloxicam (MOBIC) 15 MG tablet Take 0.5-1 tablets (7.5-15 mg total) by mouth daily. 30 tablet 2   Omega-3 Fatty Acids (FISH OIL PO) Take 1,000 mg by  mouth 2 (two) times daily.     No current facility-administered medications on file prior to visit.   Allergies  Allergen Reactions   Penicillins Hives   Rosuvastatin Calcium Other (See Comments)    Muscle pain, weakness in limbs   Statins Other (See Comments)    Muscle pain, weakness in limbs     Assessment/Plan:    1. Hyperlipidemia - Rheumatologist recommends patient not to take red yeast rice or statins -Stable on generic zetia 10mg  daily  -Tolerating nexletol samples well--will leave additional samples up front while we explore insurance  -Will call in [place] to combine nexletol and zetia together  Attached ICD10 codes to order  Will clean up med list if nexlizet covered -Scheduled patient for January 2023 when healthwell grant opens up--it will likely be expensive on insurance or not covered Patient taking fish oil OTC, RX     Regina Eck, PharmD, BCPS Clinical Pharmacist, [place]  II Phone [phone]

## 2021-02-17 ENCOUNTER — Other Ambulatory Visit: Payer: Self-pay

## 2021-02-17 ENCOUNTER — Ambulatory Visit: Payer: Medicare Other | Attending: Family Medicine | Admitting: Audiologist

## 2021-02-17 DIAGNOSIS — H903 Sensorineural hearing loss, bilateral: Secondary | ICD-10-CM | POA: Insufficient documentation

## 2021-02-17 DIAGNOSIS — H9313 Tinnitus, bilateral: Secondary | ICD-10-CM | POA: Diagnosis not present

## 2021-02-17 NOTE — Procedures (Signed)
  Outpatient Audiology and [place], [place]  [id number] [phone]  AUDIOLOGICAL  EVALUATION  NAME: Maria Briggs     DOB:   04-Aug-1950      MRN: [id number]                                                                                     DATE: 02/17/2021     REFERENT: [name], DO STATUS: Outpatient DIAGNOSIS: Tinnitus, Sensorineural Hearing Loss Bilateral     History: Maria Briggs was seen for an audiological evaluation. Maria Briggs is receiving a hearing evaluation due to concerns for the sound of crickets in both ears. She has been hearing crickets for years and they are becoming more bothersome. Maria Briggs has difficulty hearing people in the car or when they are not facing her, but she is adamant she hears ok day to day and the tinnitus is her main concern. The hearing difficulty began gradually. No pain or pressure reported in either ear. Tinnitus present in both ears. Maria Briggs has a history of noise exposure from working in a [employer] for years.  Medical history negative for any condition which is a risk factor for hearing loss. No other relevant case history reported.    Evaluation:  Otoscopy showed a clear view of the tympanic membranes, bilaterally Tympanometry results were consistent with normal middle ear function, bilaterally   Audiometric testing was completed using conventional audiometry with insert transducer. Speech Recognition Thresholds were consistent with pure tone averages. Word Recognition was excellent at an elevated level. Pure tone thresholds show normal sloping steeply to severe sensorineural hearing loss in both ears. Test results are consistent with age related change and damage from noise exposure.   Results:  The test results were reviewed with Maria Briggs, she was counseled on the nature and degree of her hearing loss. She has a severe high pitched hearing loss in both ears. She was provided her audiogram which  illustrates the degree of hearing loss in both ears. Her hearing loss is in the high frequencies preventing Maria Briggs from hearing any high frequency consonants such as /s/ /sh/ /f/ /t/ and /th/. These sounds help differentiate the words she hears. Without these sounds, speech is muffled and unclear unless someone is face to face within 5 feet without a mask. She has to lip read to 'hear' most consonant sound.  Maria Briggs is an excellent hearing aid candidate. With hearing aids the clarity of speech will improve and Maria Briggs will not have to work so hard to hear and her tinnitus awareness will likely decrease. Maria Briggs  is not interested in hearing aids at this time. She knows the hearing loss does not impact her enough for her to wear them daily. She prefers to wait, and feels she can tolerate the tinnitus without the use of hearing aids for now.   Recommendations: Amplification is necessary for both ears. Hearing aids can be purchased from a variety of locations. Patient prefers to monitor hearing. Annual hearing tests recommended to monitor hearing loss progression.     Alfonse Alpers  Audiologist, Au.D., CCC-A 02/17/2021  9:12 AM  Cc: [name], DO

## 2021-02-19 ENCOUNTER — Telehealth: Payer: Self-pay | Admitting: Pharmacist

## 2021-02-19 ENCOUNTER — Ambulatory Visit (INDEPENDENT_AMBULATORY_CARE_PROVIDER_SITE_OTHER): Payer: Medicare Other | Admitting: Pharmacist

## 2021-02-19 DIAGNOSIS — E7800 Pure hypercholesterolemia, unspecified: Secondary | ICD-10-CM

## 2021-02-19 DIAGNOSIS — I700 Atherosclerosis of aorta: Secondary | ICD-10-CM

## 2021-02-19 NOTE — Patient Instructions (Signed)
Visit Information  PATIENT GOALS:  Goals Addressed               This Visit's Progress     Patient Stated     HYPERLIPIDEMIA (pt-stated)        Current Barriers:  Unable to independently afford treatment regimen Unable to achieve control of HLD    Pharmacist Clinical Goal(s):  Over the next 90 days, patient will verbalize ability to afford treatment regimen achieve control of HLD as evidenced by IMPROVED LIPID PANEL through collaboration with PharmD and provider.    Interventions: 1:1 collaboration with [name], DO regarding development and update of comprehensive plan of care as evidenced by provider attestation and co-signature Inter-disciplinary care team collaboration (see longitudinal plan of care) Comprehensive medication review performed; medication list updated in electronic medical record  Hyperlipidemia: Uncontrolled; current treatment: ZETIA;  Medications previously tried: Intolerances: patient has tried and failed numerous statins,  Her rheumatologist recommends that she does not retry statin therapy  Risk Factors:  HTN LDL goal: <100  Diet: encourage low fat, heart healthy diet; increased fiber Exercise: n/a Family History: n/a Social History: does not smoke or drink alcohol  Lipid Panel     Component Value Date/Time   CHOL 220 (H) 01/13/2021 1022   TRIG 91 01/13/2021 1022   HDL 77 01/13/2021 1022   CHOLHDL 2.9 01/13/2021 1022   LDLCALC 127 (H) 01/13/2021 1022   LABVLDL 16 01/13/2021 1022   PLAN Rheumatologist recommends patient not to take red yeast rice or statins Stable on generic zetia 10mg  daily  Tolerating nexletol samples well--will leave additional samples up front while we explore insurance  Will call in [place] to combine nexletol and zetia together             Attached ICD10 codes to order             Will clean up med list if nexlizet covered [place] -Scheduled patient for January 2023 when healthwell grant  opens up--it will likely be expensive on insurance or not covered (PA SUBMITTED TO [place]) Patient taking fish oil OTC, RX   Patient Goals/Self-Care Activities Over the next 90 days, patient will:  - take medications as prescribed collaborate with provider on medication access solutions  Follow Up Plan: Telephone follow up appointment with care management team member scheduled for: 6 WEEKS         The patient verbalized understanding of instructions, educational materials, and care plan provided today and declined offer to receive copy of patient instructions, educational materials, and care plan.   Telephone follow up appointment with care management team member scheduled for:  Signature Regina Eck, PharmD, BCPS Clinical Pharmacist, [place]  II Phone [phone]

## 2021-02-19 NOTE — Progress Notes (Signed)
Chronic Care Management Pharmacy Note  02/19/2021 Name:  Maria Briggs MRN:  [id number] DOB:  Nov 28, 1950  Summary: Rojelio Brenner  Recommendations/Changes made from today's visit: Hyperlipidemia: Uncontrolled; current treatment: ZETIA;  Medications previously tried: Intolerances: patient has tried and failed numerous statins,  Her rheumatologist recommends that she does not retry statin therapy  Risk Factors:  HTN LDL goal: <100  Diet: encourage low fat, heart healthy diet; increased fiber Exercise: n/a Family History: n/a Social History: does not smoke or drink alcohol  Lipid Panel     Component Value Date/Time   CHOL 220 (H) 01/13/2021 1022   TRIG 91 01/13/2021 1022   HDL 77 01/13/2021 1022   CHOLHDL 2.9 01/13/2021 1022   LDLCALC 127 (H) 01/13/2021 1022   LABVLDL 16 01/13/2021 1022    PLAN Rheumatologist recommends patient not to take red yeast rice or statins Stable on generic zetia 64m daily  Tolerating nexletol samples well--will leave additional samples up front while we explore insurance  Will call in [place] combine nexletol and zetia together             Attached ICD10 codes to order             Will clean up med list if [place]-Scheduled patient for January 2023 when healthwell grant opens up--it will likely be expensive on insurance or not covered ([place] Patient taking fish oil OTC, RX    Subjective: Maria GOODENOWis an 70y.o. year old female who is a primary patient of GJanora Norlander DO.  The CCM team was consulted for assistance with disease management and care coordination needs.    Collaboration with PCP/PA  for initial visit in response to provider referral for pharmacy case management and/or care coordination services.   Consent to Services:  The patient was given the following information about Chronic Care Management services today, agreed to services, and gave verbal  consent: 1. CCM service includes personalized support from designated clinical staff supervised by the primary care provider, including individualized plan of care and coordination with other care providers 2. 24/7 contact phone numbers for assistance for urgent and routine care needs. 3. Service will only be billed when office clinical staff spend 20 minutes or more in a month to coordinate care. 4. Only one practitioner may furnish and bill the service in a calendar month. 5.The patient may stop CCM services at any time (effective at the end of the month) by phone call to the office staff. 6. The patient will be responsible for cost sharing (co-pay) of up to 20% of the service fee (after annual deductible is met). Patient agreed to services and consent obtained.  Patient Care Team: GJanora Norlander DO as PCP - General (Family Medicine) PLavera Guise [facility] Pharmac[facility] (Family Medicine) Objective:  Lab Results  Component Value Date   CREATININE 0.81 01/13/2021   CREATININE 0.77 03/16/2020   CREATININE 0.84 01/01/2019    Lab Results  Component Value Date   HGBA1C 5.4 03/16/2020   Last diabetic Eye exam: No results found for: HMDIABEYEEXA  Last diabetic Foot exam: No results found for: HMDIABFOOTEX      Component Value Date/Time   CHOL 220 (H) 01/13/2021 1022   TRIG 91 01/13/2021 1022   HDL 77 01/13/2021 1022   CHOLHDL 2.9 01/13/2021 1022   LDLCALC 127 (H) 01/13/2021 1022    Hepatic Function Latest Ref Rng & Units 01/13/2021  07/15/2020 03/16/2020  Total Protein 6.0 - 8.5 g/dL 7.0 6.9 6.8  Albumin 3.8 - 4.8 g/dL 4.4 4.4 4.4  AST 0 - 40 IU/L 9 15 13   ALT 0 - 32 IU/L 9 12 12   Alk Phosphatase 44 - 121 IU/L 85 78 77  Total Bilirubin 0.0 - 1.2 mg/dL 1.0 0.8 1.1  Bilirubin, Direct 0.00 - 0.40 mg/dL - 0.18 -    Lab Results  Component Value Date/Time   TSH 2.210 01/01/2019 02:26 PM    CBC Latest Ref Rng & Units 01/01/2019 02/11/2015  WBC 3.4 - 10.8 x10E3/uL 5.2 5.2  Hemoglobin  11.1 - 15.9 g/dL 13.9 13.7  Hematocrit 34.0 - 46.6 % 41.4 42.4  Platelets 150 - 450 x10E3/uL 259 169    No results found for: VD25OH  Clinical ASCVD: No  The 10-year ASCVD risk score (Arnett DK, et al., 2019) is: 14.1%   Values used to calculate the score:     Age: 70 years     Sex: Female     Is Non-Hispanic African American: No     Diabetic: No     Tobacco smoker: No     Systolic Blood Pressure: 161 mmHg     Is BP treated: Yes     HDL Cholesterol: 77 mg/dL     Total Cholesterol: 220 mg/dL    Other: (CHADS2VASc if Afib, PHQ9 if depression, MMRC or CAT for COPD, ACT, DEXA)  Social History   Tobacco Use  Smoking Status Never  Smokeless Tobacco Never   BP Readings from Last 3 Encounters:  01/18/21 (!) 147/86  01/13/21 (!) 156/85  07/15/20 138/78   Pulse Readings from Last 3 Encounters:  01/18/21 97  01/13/21 95  07/15/20 89   Wt Readings from Last 3 Encounters:  01/18/21 186 lb 9.6 oz (84.6 kg)  01/13/21 185 lb 3.2 oz (84 kg)  07/15/20 182 lb (82.6 kg)    Assessment: Review of patient past medical history, allergies, medications, health status, including review of consultants reports, laboratory and other test data, was performed as part of comprehensive evaluation and provision of chronic care management services.   SDOH:  (Social Determinants of Health) assessments and interventions performed:    CCM Care Plan  Allergies  Allergen Reactions   Penicillins Hives   Rosuvastatin Calcium Other (See Comments)    Muscle pain, weakness in limbs   Statins Other (See Comments)    Muscle pain, weakness in limbs    Medications Reviewed Today     Reviewed by Lavera Guise, [facility] (Pharmacist) on 02/16/21 at 1400  Med List Status: <None>   Medication Order Taking? Sig Documenting Provider Last Dose Status Informant  amLODipine (NORVASC) 5 MG tablet [id number] No TAKE 1 TABLET BY MOUTH EVERY DAY Ronnie Doss M, DO Taking Active   aspirin 81 MG tablet [id number] No  Take 81 mg by mouth daily. [provider] Taking Active Self  Bempedoic Acid (NEXLETOL) 180 MG TABS [id number] No Take 1 tablet by mouth daily. [name], DO Taking Active            Med Note Lavera Guise   Tue Feb 16, 2021  1:56 PM) samples  Bempedoic Acid-Ezetimibe (NEXLIZET) 180-10 MG TABS [id number] Yes Take 1 tablet by mouth daily. [name], DO  Active   Biotin 5 MG CAPS [id number] No Take 1 capsule by mouth daily.  [provider] Taking Active   CALCIUM PO [id number] No Take 600 mg by  mouth daily.  [provider] Taking Active Self  estradiol (ESTRACE) 0.1 MG/GM vaginal cream [id number] No Place 1 Applicatorful vaginally every other day. [provider] Taking Active   ezetimibe (ZETIA) 10 MG tablet [id number] No TAKE 1 TABLET BY MOUTH EVERY DAY Ronnie Doss M, DO Taking Active   ibuprofen (ADVIL) 800 MG tablet [id number] No Take 800 mg by mouth 3 (three) times daily.  Patient not taking: No sig reported   [provider] Not Taking Active   meloxicam (MOBIC) 15 MG tablet [id number] No Take 0.5-1 tablets (7.5-15 mg total) by mouth daily. [name], DO Taking Active   Omega-3 Fatty Acids (FISH OIL PO) [id number] No Take 1,000 mg by mouth 2 (two) times daily. [provider] Taking Active Self            Patient Active Problem List   Diagnosis Date Noted   Aortic atherosclerosis ([place]) 02/16/2021   Pure hypercholesterolemia 01/13/2021   Drug-induced myopathy 03/31/2020   Essential hypertension 02/15/2019   Bilateral hip pain 01/01/2019   Carpal tunnel syndrome on left 01/01/2019   History of recurrent UTIs 09/29/2016   Post-menopause atrophic vaginitis 03/14/2016   Incisional hernia 02/20/2015    Immunization History  Administered Date(s) Administered   Fluad Quad(high Dose 65+) 02/15/2019, 03/16/2020   Influenza Split 03/25/2013   Influenza, High Dose Seasonal PF 03/10/2017   Influenza,  Seasonal, Injecte, Preservative Fre 02/21/2015, 03/02/2016   Influenza-Unspecified 02/21/2015   PFIZER(Purple Top)SARS-COV-2 Vaccination 06/12/2019, 07/03/2019, 04/01/2020   Pneumococcal Polysaccharide-23 02/15/2018   Tdap 07/15/2020    Conditions to be addressed/monitored: HLD  Care Plan : PHARMD MEDICATION MANAGEMENT  Updates made by Lavera Guise, [place] since 02/19/2021 12:00 AM     Problem: DISEASE PROGRESSION PREVENTION      Long-Range Goal: HYPERLIPIDEMIA   This Visit's Progress: Not on track  Priority: High  Note:   Current Barriers:  Unable to independently afford treatment regimen Unable to achieve control of HLD    Pharmacist Clinical Goal(s):  Over the next 90 days, patient will verbalize ability to afford treatment regimen achieve control of HLD as evidenced by IMPROVED LIPID PANEL  through collaboration with PharmD and provider.    Interventions: 1:1 collaboration with [name], DO regarding development and update of comprehensive plan of care as evidenced by provider attestation and co-signature Inter-disciplinary care team collaboration (see longitudinal plan of care) Comprehensive medication review performed; medication list updated in electronic medical record  Hyperlipidemia: Uncontrolled; current treatment: ZETIA;  Medications previously tried: Intolerances: patient has tried and failed numerous statins,  Her rheumatologist recommends that she does not retry statin therapy  Risk Factors:  HTN LDL goal: <100  Diet: encourage low fat, heart healthy diet; increased fiber Exercise: n/a Family History: n/a Social History: does not smoke or drink alcohol  Lipid Panel     Component Value Date/Time   CHOL 220 (H) 01/13/2021 1022   TRIG 91 01/13/2021 1022   HDL 77 01/13/2021 1022   CHOLHDL 2.9 01/13/2021 1022   LDLCALC 127 (H) 01/13/2021 1022   LABVLDL 16 01/13/2021 1022   PLAN Rheumatologist recommends patient not to take red yeast rice or  statins Stable on generic zetia 6m daily  Tolerating nexletol samples well--will leave additional samples up front while we explore insurance  Will call in [place] nexletol and zetia together             Attached ICD10 codes to order  Will clean up med list if nexlizet covered [place] -Scheduled patient for January 2023 when healthwell grant opens up--it will likely be expensive on insurance or not covered (PA SUBMITTED TO [place]) Patient taking fish oil OTC, RX   Patient Goals/Self-Care Activities Over the next 90 days, patient will:  - take medications as prescribed collaborate with provider on medication access solutions  Follow Up Plan: Telephone follow up appointment with care management team member scheduled for: 6 WEEKS      Medication Assistance:  WILL APPLY [place]  Patient's preferred pharmacy is:  [id number] [id number]: [phone]: [phone] Follow Up:  Patient agrees to Care Plan and Follow-up.  Plan: Telephone follow up appointment with care management team member scheduled for:  6 WEEKS  JRegina Eck PharmD, BCPS Clinical Pharmacist, [place] Phone [phone]

## 2021-02-20 ENCOUNTER — Other Ambulatory Visit: Payer: Self-pay | Admitting: Family Medicine

## 2021-02-24 NOTE — Telephone Encounter (Signed)
Sent to plan.

## 2021-03-01 NOTE — Telephone Encounter (Signed)
NEXLIZET TAB 180/10MG , use as directed, is approved for non-formulary exception through 12/31/ 2022 under your Medicare Part D benefit. Reviewed by: System.  Pharmacy and patient aware.

## 2021-03-01 NOTE — Telephone Encounter (Signed)
Nexlizet is too expensive. She wants to know can she stay on zetia til January?

## 2021-03-03 DIAGNOSIS — Z87440 Personal history of urinary (tract) infections: Secondary | ICD-10-CM | POA: Diagnosis not present

## 2021-03-04 ENCOUNTER — Ambulatory Visit (INDEPENDENT_AMBULATORY_CARE_PROVIDER_SITE_OTHER): Payer: Medicare Other

## 2021-03-04 ENCOUNTER — Other Ambulatory Visit: Payer: Self-pay

## 2021-03-04 DIAGNOSIS — Z23 Encounter for immunization: Secondary | ICD-10-CM | POA: Diagnosis not present

## 2021-03-10 NOTE — Telephone Encounter (Signed)
Absolutely, stay on zetia until health well grant is open in January (when we can get her free nexlizet hopefully)

## 2021-03-10 NOTE — Telephone Encounter (Signed)
Pt aware Maria Briggs recommendations

## 2021-03-11 DIAGNOSIS — X32XXXD Exposure to sunlight, subsequent encounter: Secondary | ICD-10-CM | POA: Diagnosis not present

## 2021-03-11 DIAGNOSIS — L570 Actinic keratosis: Secondary | ICD-10-CM | POA: Diagnosis not present

## 2021-03-11 DIAGNOSIS — L818 Other specified disorders of pigmentation: Secondary | ICD-10-CM | POA: Diagnosis not present

## 2021-03-11 DIAGNOSIS — D0472 Carcinoma in situ of skin of left lower limb, including hip: Secondary | ICD-10-CM | POA: Diagnosis not present

## 2021-03-11 DIAGNOSIS — D692 Other nonthrombocytopenic purpura: Secondary | ICD-10-CM | POA: Diagnosis not present

## 2021-03-11 DIAGNOSIS — Z1283 Encounter for screening for malignant neoplasm of skin: Secondary | ICD-10-CM | POA: Diagnosis not present

## 2021-03-11 DIAGNOSIS — L814 Other melanin hyperpigmentation: Secondary | ICD-10-CM | POA: Diagnosis not present

## 2021-03-11 DIAGNOSIS — D225 Melanocytic nevi of trunk: Secondary | ICD-10-CM | POA: Diagnosis not present

## 2021-03-11 DIAGNOSIS — D485 Neoplasm of uncertain behavior of skin: Secondary | ICD-10-CM | POA: Diagnosis not present

## 2021-03-24 ENCOUNTER — Ambulatory Visit (INDEPENDENT_AMBULATORY_CARE_PROVIDER_SITE_OTHER): Payer: Medicare Other | Admitting: Pharmacist

## 2021-03-24 DIAGNOSIS — E7800 Pure hypercholesterolemia, unspecified: Secondary | ICD-10-CM

## 2021-03-24 DIAGNOSIS — G720 Drug-induced myopathy: Secondary | ICD-10-CM

## 2021-03-24 NOTE — Patient Instructions (Signed)
Visit Information  The patient verbalized understanding of instructions, educational materials, and care plan provided today and declined offer to receive copy of patient instructions, educational materials, and care plan.   Telephone follow up appointment with care management team member scheduled for: 05/2021  Signature Regina Eck, PharmD, BCPS Clinical Pharmacist, [place]  II Phone [phone]

## 2021-03-24 NOTE — Progress Notes (Signed)
Chronic Care Management Pharmacy Note  03/24/2021 Name:  Maria Briggs MRN:  [id number] DOB:  July 29, 1950  Summary: Rojelio Brenner  Recommendations/Changes made from today's visit: Hyperlipidemia: Uncontrolled; current treatment: ZETIA;  Medications previously tried: Intolerances: patient has tried and failed numerous statins,  Her rheumatologist recommends that she does not retry statin therapy  Risk Factors:  HTN LDL goal: <100  Diet: encourage low fat, heart healthy diet; increased fiber Exercise: n/a Family History: n/a Social History: does not smoke or drink alcohol  Lipid Panel     Component Value Date/Time   CHOL 220 (H) 01/13/2021 1022   TRIG 91 01/13/2021 1022   HDL 77 01/13/2021 1022   CHOLHDL 2.9 01/13/2021 1022   LDLCALC 127 (H) 01/13/2021 1022   LABVLDL 16 01/13/2021 1022    PLAN Rheumatologist recommends patient not to take red yeast rice or statins Stable on generic zetia 69m daily  Tolerating nexletol samples well--will leave additional samples up front Will call in Nexlizet to combine nexletol and zetia together--RX WAS >$100 PER MONTH (WILL AWAIT GRANT IN J01-11-2024Attached ICD10 codes to order Scheduled patient for J01/11/2023when healthwell grant opens up--it will likely be expensive on insurance or not covered ([place] SUBMITTED TO EXPLORE COVERAGE) Patient taking fish oil OTC, RX   Follow Up Plan: Telephone follow up appointment with care management team member scheduled for: 6 WEEKS  Subjective: Maria KOESTNERis an 70y.o. year old female who is a primary patient of GJanora Norlander DO.  The CCM team was consulted for assistance with disease management and care coordination needs.    Engaged with patient by telephone for follow up visit in response to provider referral for pharmacy case management and/or care coordination services.   Consent to Services:  The patient was given information about Chronic Care Management services, agreed to  services, and gave verbal consent prior to initiation of services.  Please see initial visit note for detailed documentation.   Patient Care Team: GJanora Norlander DO as PCP - General (Family Medicine) PLavera Guise [facility] Pharmacist (Family Medicine)  Objective:  Lab Results  Component Value Date   CREATININE 0.81 01/13/2021   CREATININE 0.77 03/16/2020   CREATININE 0.84 01/01/2019    Lab Results  Component Value Date   HGBA1C 5.4 03/16/2020   Last diabetic Eye exam: No results found for: HMDIABEYEEXA  Last diabetic Foot exam: No results found for: HMDIABFOOTEX      Component Value Date/Time   CHOL 220 (H) 01/13/2021 1022   TRIG 91 01/13/2021 1022   HDL 77 01/13/2021 1022   CHOLHDL 2.9 01/13/2021 1022   LDLCALC 127 (H) 01/13/2021 1022    Hepatic Function Latest Ref Rng & Units 01/13/2021 07/15/2020 03/16/2020  Total Protein 6.0 - 8.5 g/dL 7.0 6.9 6.8  Albumin 3.8 - 4.8 g/dL 4.4 4.4 4.4  AST 0 - 40 IU/L _0 ALT 0 - 32 IU/L _1 Alk Phosphatase 44 - 121 IU/L 85 78 77  Total Bilirubin 0.0 - 1.2 mg/dL 1.0 0.8 1.1  Bilirubin, Direct 0.00 - 0.40 mg/dL - 0.18 -    Lab Results  Component Value Date/Time   TSH 2.210 01/01/2019 02:26 PM    CBC Latest Ref Rng & Units 01/01/2019 02/11/2015  WBC 3.4 - 10.8 x10E3/uL 5.2 5.2  Hemoglobin 11.1 - 15.9 g/dL 13.9 13.7  Hematocrit 34.0 - 46.6 % 41.4 42.4  Platelets 150 - 450 x10E3/uL 259 169  No results found for: VD25OH  Clinical ASCVD: No  The 10-year ASCVD risk score (Arnett DK, et al., 2019) is: 20.4%   Values used to calculate the score:     Age: 70 years     Sex: Female     Is Non-Hispanic African American: No     Diabetic: No     Tobacco smoker: No     Systolic Blood Pressure: 048 mmHg     Is BP treated: Yes     HDL Cholesterol: 77 mg/dL     Total Cholesterol: 220 mg/dL    Other: (CHADS2VASc if Afib, PHQ9 if depression, MMRC or CAT for COPD, ACT, DEXA)  Social History   Tobacco Use  Smoking  Status Never  Smokeless Tobacco Never   BP Readings from Last 3 Encounters:  01/18/21 (!) 147/86  01/13/21 (!) 156/85  07/15/20 138/78   Pulse Readings from Last 3 Encounters:  01/18/21 97  01/13/21 95  07/15/20 89   Wt Readings from Last 3 Encounters:  01/18/21 186 lb 9.6 oz (84.6 kg)  01/13/21 185 lb 3.2 oz (84 kg)  07/15/20 182 lb (82.6 kg)    Assessment: Review of patient past medical history, allergies, medications, health status, including review of consultants reports, laboratory and other test data, was performed as part of comprehensive evaluation and provision of chronic care management services.   SDOH:  (Social Determinants of Health) assessments and interventions performed:    CCM Care Plan  Allergies  Allergen Reactions   Penicillins Hives   Rosuvastatin Calcium Other (See Comments)    Muscle pain, weakness in limbs   Statins Other (See Comments)    Muscle pain, weakness in limbs    Medications Reviewed Today     Reviewed by Lavera Guise, [facility] (Pharmacist) on 02/16/21 at 1400  Med List Status: <None>   Medication Order Taking? Sig Documenting Provider Last Dose Status Informant  amLODipine (NORVASC) 5 MG tablet [id number] No TAKE 1 TABLET BY MOUTH EVERY DAY Ronnie Doss M, DO Taking Active   aspirin 81 MG tablet [id number] No Take 81 mg by mouth daily. [provider] Taking Active Self  Bempedoic Acid (NEXLETOL) 180 MG TABS [id number] No Take 1 tablet by mouth daily. [name], DO Taking Active            Med Note Lavera Guise   Tue Feb 16, 2021  1:56 PM) samples  Bempedoic Acid-Ezetimibe (NEXLIZET) 180-10 MG TABS [id number] Yes Take 1 tablet by mouth daily. [name], DO  Active   Biotin 5 MG CAPS [id number] No Take 1 capsule by mouth daily.  [provider] Taking Active   CALCIUM PO [id number] No Take 600 mg by mouth daily.  [provider] Taking Active Self  estradiol (ESTRACE) 0.1 MG/GM vaginal  cream [id number] No Place 1 Applicatorful vaginally every other day. [provider] Taking Active   ezetimibe (ZETIA) 10 MG tablet [id number] No TAKE 1 TABLET BY MOUTH EVERY DAY Ronnie Doss M, DO Taking Active   ibuprofen (ADVIL) 800 MG tablet [id number] No Take 800 mg by mouth 3 (three) times daily.  Patient not taking: No sig reported   [provider] Not Taking Active   meloxicam (MOBIC) 15 MG tablet [id number] No Take 0.5-1 tablets (7.5-15 mg total) by mouth daily. [name], DO Taking Active   Omega-3 Fatty Acids (FISH OIL PO) [id number] No Take 1,000 mg by mouth 2 (two) times daily. [provider]  Taking Active Self            Patient Active Problem List   Diagnosis Date Noted   Aortic atherosclerosis ([place]) 02/16/2021   Pure hypercholesterolemia 01/13/2021   Drug-induced myopathy 03/31/2020   Essential hypertension 02/15/2019   Bilateral hip pain 01/01/2019   Carpal tunnel syndrome on left 01/01/2019   History of recurrent UTIs 09/29/2016   Post-menopause atrophic vaginitis 03/14/2016   Incisional hernia 02/20/2015    Immunization History  Administered Date(s) Administered   Fluad Quad(high Dose 65+) 02/15/2019, 03/16/2020, 03/04/2021   Influenza Split 03/25/2013   Influenza, High Dose Seasonal PF 03/10/2017   Influenza, Seasonal, Injecte, Preservative Fre 02/21/2015, 03/02/2016   Influenza-Unspecified 02/21/2015   PFIZER(Purple Top)SARS-COV-2 Vaccination 06/12/2019, 07/03/2019, 04/01/2020   Pneumococcal Polysaccharide-23 02/15/2018   Tdap 07/15/2020    Conditions to be addressed/monitored: HLD  Care Plan : PHARMD MEDICATION MANAGEMENT  Updates made by Lavera Guise, [place] since 03/24/2021 12:00 AM     Problem: DISEASE PROGRESSION PREVENTION      Long-Range Goal: HYPERLIPIDEMIA   Recent Progress: Not on track  Priority: High  Note:   Current Barriers:  Unable to independently afford treatment regimen Unable to achieve  control of HLD    Pharmacist Clinical Goal(s):  Over the next 90 days, patient will verbalize ability to afford treatment regimen achieve control of HLD as evidenced by IMPROVED LIPID PANEL  through collaboration with PharmD and provider.    Interventions: 1:1 collaboration with [name], DO regarding development and update of comprehensive plan of care as evidenced by provider attestation and co-signature Inter-disciplinary care team collaboration (see longitudinal plan of care) Comprehensive medication review performed; medication list updated in electronic medical record  Hyperlipidemia: Uncontrolled; current treatment: ZETIA;  Medications previously tried: Intolerances: patient has tried and failed numerous statins,  Her rheumatologist recommends that she does not retry statin therapy  Risk Factors:  HTN LDL goal: <100  Diet: encourage low fat, heart healthy diet; increased fiber Exercise: n/a Family History: n/a Social History: does not smoke or drink alcohol  Lipid Panel     Component Value Date/Time   CHOL 220 (H) 01/13/2021 1022   TRIG 91 01/13/2021 1022   HDL 77 01/13/2021 1022   CHOLHDL 2.9 01/13/2021 1022   LDLCALC 127 (H) 01/13/2021 1022   LABVLDL 16 01/13/2021 1022   PLAN Rheumatologist recommends patient not to take red yeast rice or statins Stable on generic zetia 54m daily  Tolerating nexletol samples well--will leave additional samples up front Will call in [place] combine nexletol and zetia together--RX WAS >$100 PER MONTH (WILL AWAIT GRANT IN J01/17/2024Attached ICD10 codes to order  Scheduled patient for J01/17/23when healthwell grant opens up--it will likely be expensive on insurance or not covered ([place] SUBMITTED TO EXPLORE COVERAGE) Patient taking fish oil OTC, RX   Patient Goals/Self-Care Activities Over the next 90 days, patient will:  - take medications as prescribed collaborate with provider on medication access  solutions  Follow Up Plan: Telephone follow up appointment with care management team member scheduled for: 6 WEEKS      Medication Assistance:  [place] Patient's [facility] pharmacy [facility]  [id number] [id number]: [phone]: [phone] Up:  Patient agrees to Care Plan and Follow-up.  Plan: Telephone follow up appointment with care management team member scheduled for:  1/Jun 08, 2021  Regina Eck, PharmD, BCPS Clinical Pharmacist, [place]  II Phone [phone]

## 2021-03-29 DIAGNOSIS — L988 Other specified disorders of the skin and subcutaneous tissue: Secondary | ICD-10-CM | POA: Diagnosis not present

## 2021-03-29 DIAGNOSIS — D485 Neoplasm of uncertain behavior of skin: Secondary | ICD-10-CM | POA: Diagnosis not present

## 2021-05-10 DIAGNOSIS — Z08 Encounter for follow-up examination after completed treatment for malignant neoplasm: Secondary | ICD-10-CM | POA: Diagnosis not present

## 2021-05-10 DIAGNOSIS — D225 Melanocytic nevi of trunk: Secondary | ICD-10-CM | POA: Diagnosis not present

## 2021-05-10 DIAGNOSIS — D485 Neoplasm of uncertain behavior of skin: Secondary | ICD-10-CM | POA: Diagnosis not present

## 2021-05-10 DIAGNOSIS — Z85828 Personal history of other malignant neoplasm of skin: Secondary | ICD-10-CM | POA: Diagnosis not present

## 2021-05-10 DIAGNOSIS — L820 Inflamed seborrheic keratosis: Secondary | ICD-10-CM | POA: Diagnosis not present

## 2021-05-23 ENCOUNTER — Other Ambulatory Visit: Payer: Self-pay | Admitting: Family Medicine

## 2021-05-23 DIAGNOSIS — M25512 Pain in left shoulder: Secondary | ICD-10-CM

## 2021-05-23 DIAGNOSIS — G8929 Other chronic pain: Secondary | ICD-10-CM

## 2021-05-23 DIAGNOSIS — I10 Essential (primary) hypertension: Secondary | ICD-10-CM

## 2021-05-25 ENCOUNTER — Telehealth: Payer: Self-pay | Admitting: Family Medicine

## 2021-05-25 ENCOUNTER — Ambulatory Visit (INDEPENDENT_AMBULATORY_CARE_PROVIDER_SITE_OTHER): Payer: Medicare Other | Admitting: Pharmacist

## 2021-05-25 DIAGNOSIS — G720 Drug-induced myopathy: Secondary | ICD-10-CM

## 2021-05-25 DIAGNOSIS — I700 Atherosclerosis of aorta: Secondary | ICD-10-CM

## 2021-05-25 DIAGNOSIS — E7800 Pure hypercholesterolemia, unspecified: Secondary | ICD-10-CM

## 2021-05-25 MED ORDER — NEXLIZET 180-10 MG PO TABS: 1.0000 | ORAL_TABLET | Freq: Every day | ORAL | 5 refills | Status: DC

## 2021-05-25 NOTE — Progress Notes (Signed)
Chronic Care Management Pharmacy Note  05/25/2021 Name:  Maria Briggs MRN:  [id number] DOB:  [birth year]  Summary: hyperlipidemia  Recommendations/Changes made from today's visit: Hyperlipidemia: Uncontrolled; current treatment: ZETIA;  Medications previously tried: Intolerances: patient has tried and failed numerous statins,  Her rheumatologist recommends that she does not retry statin therapy  Risk Factors:  HTN LDL goal: <100  Diet: encourage low fat, heart healthy diet; increased fiber Exercise: n/a Family History: n/a Social History: does not smoke or drink alcohol  Lipid Panel     Component Value Date/Time   CHOL 220 (H) 01/13/2021 1022   TRIG 91 01/13/2021 1022   HDL 77 01/13/2021 1022   CHOLHDL 2.9 01/13/2021 1022   LDLCALC 127 (H) 01/13/2021 1022   LABVLDL 16 01/13/2021 1022    PLAN Rheumatologist recommends patient not to take red yeast rice or statins Stable on generic zetia 20m daily  Tolerating nexletol samples well--will leave additional samples up front Will call in Nexlizet to combine nexletol and zetia together--RX WAS >$100 PER MONTH  Attached ICD10 codes to order--will work on PBlueLinxsubmitted for hDTE Energy Companyto cover Nexlizet Patient taking fish oil OTC, RX   Patient Goals/Self-Care Activities Over the next 90 days, patient will:  - take medications as prescribed collaborate with provider on medication access solutions  Follow Up Plan: Telephone follow up appointment with care management team member scheduled for: 6 WEEKS  Subjective: Maria CABACUNGANis an 71y.o. year old female who is a primary patient of GJanora Norlander DO.  The CCM team was consulted for assistance with disease management and care coordination needs.    Engaged with patient by telephone for follow up visit in response to provider referral for pharmacy case management and/or care coordination services.   Consent to Services:  The patient  was given information about Chronic Care Management services, agreed to services, and gave verbal consent prior to initiation of services.  Please see initial visit note for detailed documentation.   Patient Care Team: GJanora Norlander DO as PCP - General (Family Medicine) PLavera Guise [facility] Pharmacist (Family Medicine)   Objective:  Lab Results  Component Value Date   CREATININE 0.81 01/13/2021   CREATININE 0.77 03/16/2020   CREATININE 0.84 01/01/2019    Lab Results  Component Value Date   HGBA1C 5.4 03/16/2020   Last diabetic Eye exam: No results found for: HMDIABEYEEXA  Last diabetic Foot exam: No results found for: HMDIABFOOTEX      Component Value Date/Time   CHOL 220 (H) 01/13/2021 1022   TRIG 91 01/13/2021 1022   HDL 77 01/13/2021 1022   CHOLHDL 2.9 01/13/2021 1022   LDLCALC 127 (H) 01/13/2021 1022    Hepatic Function Latest Ref Rng & Units 01/13/2021 07/15/2020 03/16/2020  Total Protein 6.0 - 8.5 g/dL 7.0 6.9 6.8  Albumin 3.8 - 4.8 g/dL 4.4 4.4 4.4  AST 0 - 40 IU/L 9 15 13   ALT 0 - 32 IU/L 9 12 12   Alk Phosphatase 44 - 121 IU/L 85 78 77  Total Bilirubin 0.0 - 1.2 mg/dL 1.0 0.8 1.1  Bilirubin, Direct 0.00 - 0.40 mg/dL - 0.18 -    Lab Results  Component Value Date/Time   TSH 2.210 01/01/2019 02:26 PM    CBC Latest Ref Rng & Units 01/01/2019 02/11/2015  WBC 3.4 - 10.8 x10E3/uL 5.2 5.2  Hemoglobin 11.1 - 15.9 g/dL 13.9 13.7  Hematocrit 34.0 - 46.6 % 41.4  42.4  Platelets 150 - 450 x10E3/uL 259 169    No results found for: VD25OH  Clinical ASCVD: No  The 10-year ASCVD risk score (Arnett DK, et al., 2019) is: 20.4%   Values used to calculate the score:     Age: 71 years     Sex: Female     Is Non-Hispanic African American: No     Diabetic: No     Tobacco smoker: No     Systolic Blood Pressure: 062 mmHg     Is BP treated: Yes     HDL Cholesterol: 77 mg/dL     Total Cholesterol: 220 mg/dL    Other: (CHADS2VASc if Afib, PHQ9 if depression, MMRC  or CAT for COPD, ACT, DEXA)  Social History   Tobacco Use  Smoking Status Never  Smokeless Tobacco Never   BP Readings from Last 3 Encounters:  01/18/21 (!) 147/86  01/13/21 (!) 156/85  07/15/20 138/78   Pulse Readings from Last 3 Encounters:  01/18/21 97  01/13/21 95  07/15/20 89   Wt Readings from Last 3 Encounters:  01/18/21 186 lb 9.6 oz (84.6 kg)  01/13/21 185 lb 3.2 oz (84 kg)  07/15/20 182 lb (82.6 kg)    Assessment: Review of patient past medical history, allergies, medications, health status, including review of consultants reports, laboratory and other test data, was performed as part of comprehensive evaluation and provision of chronic care management services.   SDOH:  (Social Determinants of Health) assessments and interventions performed:    CCM Care Plan  Allergies  Allergen Reactions   Penicillins Hives   Rosuvastatin Calcium Other (See Comments)    Muscle pain, weakness in limbs   Statins Other (See Comments)    Muscle pain, weakness in limbs    Medications Reviewed Today     Reviewed by Lavera Guise, [facility] (Pharmacist) on 05/25/21 at [place] List Status: <None>   Medication Order Taking? Sig Documenting Provider Last Dose Status Informant  amLODipine (NORVASC) 5 MG tablet [id number]  TAKE 1 TABLET BY MOUTH EVERY DAY Ronnie Doss M, DO  Active   aspirin 81 MG tablet [id number] No Take 81 mg by mouth daily. [provider] Taking Active Self    Discontinued 05/25/21 1103 (Change in therapy)            Med Note Blanca Friend, Mollye Guinta D   Wed Mar 24, 2021  1:17 PM) samples  Bempedoic Acid-Ezetimibe (NEXLIZET) 180-10 MG TABS [id number] Yes Take 1 tablet by mouth daily. [name], DO  Active   Biotin 5 MG CAPS [id number] No Take 1 capsule by mouth daily.  [provider] Taking Active   CALCIUM PO [id number] No Take 600 mg by mouth daily.  [provider] Taking Active Self  estradiol (ESTRACE) 0.1 MG/GM vaginal cream  [id number] No Place 1 Applicatorful vaginally every other day. [provider] Taking Active     Discontinued 05/25/21 1103 (Change in therapy)   meloxicam (MOBIC) 15 MG tablet [id number]  TAKE 0.5-1 TABLETS (7.5-15 MG TOTAL) BY MOUTH DAILY. Ronnie Doss M, DO  Active   Omega-3 Fatty Acids (FISH OIL PO) [id number] No Take 1,000 mg by mouth 2 (two) times daily. [provider] Taking Active Self            Patient Active Problem List   Diagnosis Date Noted   Aortic atherosclerosis ([place]) 02/16/2021   Pure hypercholesterolemia 01/13/2021   Drug-induced myopathy 03/31/2020   Essential hypertension 02/15/2019  Bilateral hip pain 01/01/2019   Carpal tunnel syndrome on left 01/01/2019   History of recurrent UTIs 09/29/2016   Post-menopause atrophic vaginitis 03/14/2016   Incisional hernia 02/20/2015    Immunization History  Administered Date(s) Administered   Fluad Quad(high Dose 65+) 02/15/2019, 03/16/2020, 03/04/2021   Influenza Split 03/25/2013   Influenza, High Dose Seasonal PF 03/10/2017   Influenza, Seasonal, Injecte, Preservative Fre 02/21/2015, 03/02/2016   Influenza-Unspecified 02/21/2015   PFIZER(Purple Top)SARS-COV-2 Vaccination 06/12/2019, 07/03/2019, 04/01/2020   Pfizer Covid-19 Vaccine Bivalent Booster 75yr & up 04/07/2021   Pneumococcal Polysaccharide-23 02/15/2018   Tdap 07/15/2020    Conditions to be addressed/monitored: HLD  Care Plan : PHARMD MEDICATION MANAGEMENT  Updates made by PLavera Guise [place] 05/25/2021 12:00 AM     Problem: DISEASE PROGRESSION PREVENTION      Long-Range Goal: HYPERLIPIDEMIA   Recent Progress: Not on track  Priority: High  Note:   Current Barriers:  Unable to independently afford treatment regimen Unable to achieve control of HLD    Pharmacist Clinical Goal(s):  Over the next 90 days, patient will verbalize ability to afford treatment regimen achieve control of HLD as evidenced by IMPROVED LIPID  PANEL  through collaboration with PharmD and provider.    Interventions: 1:1 collaboration with GJanora Norlander DO regarding development and update of comprehensive plan of care as evidenced by provider attestation and co-signature Inter-disciplinary care team collaboration (see longitudinal plan of care) Comprehensive medication review performed; medication list updated in electronic medical record  Hyperlipidemia: Uncontrolled; current treatment: ZETIA;  Medications previously tried: Intolerances: patient has tried and failed numerous statins,  Her rheumatologist recommends that she does not retry statin therapy  Risk Factors:  HTN LDL goal: <100  Diet: encourage low fat, heart healthy diet; increased fiber Exercise: n/a Family History: n/a Social History: does not smoke or drink alcohol  Lipid Panel     Component Value Date/Time   CHOL 220 (H) 01/13/2021 1022   TRIG 91 01/13/2021 1022   HDL 77 01/13/2021 1022   CHOLHDL 2.9 01/13/2021 1022   LDLCALC 127 (H) 01/13/2021 1022   LABVLDL 16 01/13/2021 1022    PLAN Rheumatologist recommends patient not to take red yeast rice or statins Stable on generic zetia 125mdaily  Tolerating nexletol samples well--will leave additional samples up front [place] nexletol and zetia together--RX WAS >$100 PER MONTH  Attached ICD10 codes to order--will work on PABlueLinxubmitted for heDTE Energy Companyo cover Ne[place]arter[place]atient taking fish oil OTC, RX   Patient Goals/Self-Care Activities Over the next 90 days, patient will:  - take medications as prescribed collaborate with provider on medication access solutions  Follow Up Plan: Telephone follow up appointment with care management team member scheduled for: 6 WEEKS      Medication Assistance:  submitted application for healthwell foundation grant--for cholesterol medications  Patient[facility]  [id number] [id number]: [phone]: [phone] Up:  Patient agrees to Care Plan and Follow-up.  Plan: Telephone follow up appointment with care management team member scheduled for:  6 weeks   JulRegina EckharmD, BCPS Clinical Pharmacist, [place] Phone [phone]

## 2021-05-25 NOTE — Patient Instructions (Signed)
Visit Information  Thank you for taking time to visit with me today. Please don't hesitate to contact me if I can be of assistance to you before our next scheduled telephone appointment.  Following are the goals we discussed today:  Current Barriers:  Unable to independently afford treatment regimen Unable to achieve control of HLD    Pharmacist Clinical Goal(s):  Over the next 90 days, patient will verbalize ability to afford treatment regimen achieve control of HLD as evidenced by IMPROVED LIPID PANEL through collaboration with PharmD and provider.    Interventions: 1:1 collaboration with [name], DO regarding development and update of comprehensive plan of care as evidenced by provider attestation and co-signature Inter-disciplinary care team collaboration (see longitudinal plan of care) Comprehensive medication review performed; medication list updated in electronic medical record  Hyperlipidemia: Uncontrolled; current treatment: ZETIA;  Medications previously tried: Intolerances: patient has tried and failed numerous statins,  Her rheumatologist recommends that she does not retry statin therapy  Risk Factors:  HTN LDL goal: <100  Diet: encourage low fat, heart healthy diet; increased fiber Exercise: n/a Family History: n/a Social History: does not smoke or drink alcohol  Lipid Panel     Component Value Date/Time   CHOL 220 (H) 01/13/2021 1022   TRIG 91 01/13/2021 1022   HDL 77 01/13/2021 1022   CHOLHDL 2.9 01/13/2021 1022   LDLCALC 127 (H) 01/13/2021 1022   LABVLDL 16 01/13/2021 1022    PLAN Rheumatologist recommends patient not to take red yeast rice or statins Stable on generic zetia 10mg  daily  Tolerating nexletol samples well--will leave additional samples up front Will call in [place] to combine nexletol and zetia together--RX WAS >$100 PER MONTH  Attached ICD10 codes to order--will work on [employer] submitted for [employer]  to cover Nexlizet Patient taking fish oil OTC, RX   Patient Goals/Self-Care Activities Over the next 90 days, patient will:  - take medications as prescribed collaborate with provider on medication access solutions  Follow Up Plan: Telephone follow up appointment with care management team member scheduled for: 6 WEEKS   Please call the care guide team at [phone] if you need to cancel or reschedule your appointment.    The patient verbalized understanding of instructions, educational materials, and care plan provided today and declined offer to receive copy of patient instructions, educational materials, and care plan.   Signature Regina Eck, PharmD, BCPS Clinical Pharmacist, [place]  II Phone [phone]

## 2021-05-26 ENCOUNTER — Encounter: Payer: Self-pay | Admitting: Internal Medicine

## 2021-05-26 ENCOUNTER — Encounter: Payer: Self-pay | Admitting: Family Medicine

## 2021-05-26 ENCOUNTER — Ambulatory Visit (INDEPENDENT_AMBULATORY_CARE_PROVIDER_SITE_OTHER): Payer: Medicare Other

## 2021-05-26 ENCOUNTER — Ambulatory Visit (INDEPENDENT_AMBULATORY_CARE_PROVIDER_SITE_OTHER): Payer: Medicare Other | Admitting: Family Medicine

## 2021-05-26 VITALS — BP 146/80 | HR 102 | Temp 97.9°F | Ht 63.0 in | Wt 182.2 lb

## 2021-05-26 DIAGNOSIS — R1032 Left lower quadrant pain: Secondary | ICD-10-CM

## 2021-05-26 DIAGNOSIS — K5792 Diverticulitis of intestine, part unspecified, without perforation or abscess without bleeding: Secondary | ICD-10-CM

## 2021-05-26 DIAGNOSIS — Z87442 Personal history of urinary calculi: Secondary | ICD-10-CM | POA: Diagnosis not present

## 2021-05-26 MED ORDER — METRONIDAZOLE 500 MG PO TABS: 500.0000 mg | ORAL_TABLET | Freq: Two times a day (BID) | ORAL | 0 refills | Status: AC

## 2021-05-26 MED ORDER — CIPROFLOXACIN HCL 500 MG PO TABS
500.0000 mg | ORAL_TABLET | Freq: Two times a day (BID) | ORAL | 0 refills | Status: AC
Start: 2021-05-26 — End: 2021-06-02

## 2021-05-26 NOTE — Progress Notes (Signed)
Subjective: CC: Left lower quadrant pain PCP: Maria Norlander, DO Maria Briggs is a 71 y.o. female presenting to clinic today for:  1.  LL quadrant pain Patient reports onset of left lower quadrant pain 2 days ago.  She states it feels extremely similar to when she had diverticulitis back in September.  Diverticulitis was confirmed on CT scan and at that time they also noted a very small stone.  She does report that prior to the onset of symptoms she did have some left back pain but that resolved.  Denies any dysuria, hematuria, nausea, vomiting, hematochezia, diarrhea or decreased p.o. intake.  No fever.  She is tolerating fluids without difficulty.  She does admit to drinking a lot of soda.  Symptoms onset after she ate something spicy.  Has not reached out to her gastroenterologist.   ROS: Per HPI  Allergies  Allergen Reactions   Penicillins Hives   Rosuvastatin Calcium Other (See Comments)    Muscle pain, weakness in limbs   Statins Other (See Comments)    Muscle pain, weakness in limbs   Past Medical History:  Diagnosis Date   Arthritis    "maybe a little; comes & goes; various parts of my body" (02/20/2015)   Hyperlipidemia    PONV (postoperative nausea and vomiting)     Current Outpatient Medications:    amLODipine (NORVASC) 5 MG tablet, TAKE 1 TABLET BY MOUTH EVERY DAY, Disp: 90 tablet, Rfl: 0   aspirin 81 MG tablet, Take 81 mg by mouth daily., Disp: , Rfl:    Bempedoic Acid-Ezetimibe (NEXLIZET) 180-10 MG TABS, Take 1 tablet by mouth daily., Disp: 30 tablet, Rfl: 5   Biotin 5 MG CAPS, Take 1 capsule by mouth daily. , Disp: , Rfl:    estradiol (ESTRACE) 0.1 MG/GM vaginal cream, Place 1 Applicatorful vaginally every other day., Disp: , Rfl:    meloxicam (MOBIC) 15 MG tablet, TAKE 0.5-1 TABLETS (7.5-15 MG TOTAL) BY MOUTH DAILY., Disp: 30 tablet, Rfl: 2   Omega-3 Fatty Acids (FISH OIL PO), Take 1,000 mg by mouth 2 (two) times daily., Disp: , Rfl:    CALCIUM PO,  Take 600 mg by mouth daily.  (Patient not taking: Reported on 05/26/2021), Disp: , Rfl:  Social History   Socioeconomic History   Marital status: Married    Spouse name: Coralyn Mark   Number of children: 2   Years of education: Not on file   Highest education level: High school graduate  Occupational History   Occupation: retired  Tobacco Use   Smoking status: Never   Smokeless tobacco: Never  Vaping Use   Vaping Use: Never used  Substance and Sexual Activity   Alcohol use: Not Currently    Comment: 02/20/2015 "might have a drink q 6 months"   Drug use: No   Sexual activity: Not Currently  Other Topics Concern   Not on file  Social History Narrative   Retired, lives with husband Coralyn Mark, 2 children, enjoys reading and puzzles   Social Determinants of Radio broadcast assistant Strain: Not on file  Food Insecurity: Not on file  Transportation Needs: Not on file  Physical Activity: Not on file  Stress: Not on file  Social Connections: Not on file  Intimate Partner Violence: Not on file   Family History  Problem Relation Age of Onset   Colon polyps Father    Colon cancer Father        ?   Prostate cancer Father  Dementia Father    COPD Father    Anxiety disorder Mother    COPD Mother    Depression Mother    Heart attack Brother 36   Mood Disorder Son    Stroke Paternal Grandmother    Diabetes Paternal Grandmother    Stroke Paternal Grandfather    Esophageal cancer Neg Hx    Stomach cancer Neg Hx    Rectal cancer Neg Hx     Objective: Office vital signs reviewed. BP (!) 146/80    Pulse (!) 102    Temp 97.9 F (36.6 C)    Ht 5\' 3"  (1.6 m)    Wt 182 lb 3.2 oz (82.6 kg)    SpO2 96%    BMI 32.28 kg/m   Physical Examination:  General: Awake, alert, well nourished, nontoxic female. No acute distress HEENT: Sclera anicteric GI: Soft, nondistended.  Moderate tenderness palpation along the left lower quadrant extending into the midline. GU: No CVA tenderness to  palpation  DG Abd 1 View  Result Date: 05/26/2021 CLINICAL DATA:  Left lower quadrant abdominal pain. EXAM: ABDOMEN - 1 VIEW COMPARISON:  07/07/2020 FINDINGS: The lung bases are grossly clear. Moderate stool in the right colon but no significant stool burden overall. No distended small bowel loops to suggest obstruction. The soft tissue shadows of the abdomen are maintained. No worrisome calcifications. The bony structures are unremarkable. IMPRESSION: No findings for obstruction or perforation. Electronically Signed   By: Marijo Sanes M.D.   On: 05/26/2021 09:23    Assessment/ Plan: 71 y.o. female   Diverticulitis - Plan: ciprofloxacin (CIPRO) 500 MG tablet, metroNIDAZOLE (FLAGYL) 500 MG tablet  LLQ pain - Plan: DG Abd 1 View  History of renal stone - Plan: DG Abd 1 View  Concerned about recurrent diverticulitis in this patient.  She had moderate tenderness palpation on exam today.  No rebound or guarding.  Plain film showed no signs of perforation in did not show any evidence of renal stone which was [facility] differential diagnosis.  Given her recurrence I have reached out to her gastroenterologist for further recommendations.  May need to consider referral to general surgery given recurrence.   Orders Placed This Encounter  Procedures   DG Abd 1 View    Standing Status:   Future    Standing Expiration Date:   09/23/2021    Order Specific Question:   Reason for Exam (SYMPTOM  OR DIAGNOSIS REQUIRED)    Answer:   LLQ pain. h/o renal stone and diverticulitis    Order Specific Question:   Preferred imaging location?    Answer:   Internal   No orders of the defined types were placed in this encounter.    Maria Norlander, DO [place] [phone]

## 2021-06-22 DIAGNOSIS — E7800 Pure hypercholesterolemia, unspecified: Secondary | ICD-10-CM

## 2021-06-23 ENCOUNTER — Telehealth: Payer: Self-pay | Admitting: Family Medicine

## 2021-06-23 NOTE — Telephone Encounter (Signed)
Patient aware and verbalizes understanding. 

## 2021-06-23 NOTE — Telephone Encounter (Signed)
°  Please let patient know:  -She's been approved for the [employer] -She should have received a bright orange bill sized envelop from the [employer] (most people throw it away and think it's junk mail) -I left a copy up front since we get back up copy -She needs to take page 3 to the local pharmacy and it will cover her nexlizet

## 2021-06-24 ENCOUNTER — Ambulatory Visit: Payer: Medicare Other | Admitting: Internal Medicine

## 2021-06-24 ENCOUNTER — Encounter: Payer: Self-pay | Admitting: Internal Medicine

## 2021-06-24 VITALS — BP 152/84 | HR 107 | Ht 63.0 in | Wt 182.0 lb

## 2021-06-24 DIAGNOSIS — K5732 Diverticulitis of large intestine without perforation or abscess without bleeding: Secondary | ICD-10-CM | POA: Diagnosis not present

## 2021-06-24 DIAGNOSIS — Z800 Family history of malignant neoplasm of digestive organs: Secondary | ICD-10-CM | POA: Diagnosis not present

## 2021-06-24 DIAGNOSIS — K5900 Constipation, unspecified: Secondary | ICD-10-CM

## 2021-06-24 DIAGNOSIS — R935 Abnormal findings on diagnostic imaging of other abdominal regions, including retroperitoneum: Secondary | ICD-10-CM | POA: Diagnosis not present

## 2021-06-24 MED ORDER — METRONIDAZOLE 500 MG PO TABS: 500.0000 mg | ORAL_TABLET | Freq: Two times a day (BID) | ORAL | 1 refills | Status: DC

## 2021-06-24 MED ORDER — SUTAB 1479-225-188 MG PO TABS: 1.0000 | ORAL_TABLET | Freq: Once | ORAL | 0 refills | Status: AC

## 2021-06-24 MED ORDER — ONDANSETRON HCL 4 MG PO TABS: ORAL_TABLET | ORAL | 0 refills | Status: DC

## 2021-06-24 MED ORDER — CIPROFLOXACIN HCL 500 MG PO TABS: 500.0000 mg | ORAL_TABLET | Freq: Two times a day (BID) | ORAL | 1 refills | Status: DC

## 2021-06-24 NOTE — Progress Notes (Signed)
HISTORY OF PRESENT ILLNESS:  Maria Briggs is a 71 y.o. female with minimal past medical history as listed below who presents today at the encouragement of her PCP, upon referral, regarding recurrent diverticulitis.  Patient has been seen on multiple occasions for screening colonoscopy.  There is a family history of colon cancer in her father less than age 76.  Previous examinations have been performed in 1993, 2003, 2007, 2013, and most recently 2018.  Her most recent examination revealed pandiverticulosis.  No neoplasia.  Follow-up in 5 years recommended.  Patient tells me that she developed problems with left-sided abdominal discomfort in the early part of last year.  This resolved spontaneously.  Blood work from August 2022 was unremarkable.  Normal comprehensive metabolic panel.  She developed a subsequent episode in September 2022.  This was worse.  CT scan of the abdomen and pelvis February 03, 2021 revealed stranding around the sigmoid colon consistent with acute diverticulitis.  She was treated with antibiotics and improved.  She was seen again early January 2023 regarding recurrent left lower quadrant pain similar to her previous bouts.  She was treated with ciprofloxacin and metronidazole for about a week.  Symptoms mostly improved.  Due to some residual discomfort she took an antibiotic that she had at home (from her urologist, type uncertain).  Now she states that her discomfort is much better.  She still feels some mild discomfort.  She is allergic to penicillin.  She tolerated Cipro and Flagyl.  She does have some mild chronic constipation.  She is worried about colon cancer.  REVIEW OF SYSTEMS:  All non-GI ROS negative unless otherwise stated in the HPI except for anxiety, visual change, hearing problems  Past Medical History:  Diagnosis Date   Arthritis    "maybe a little; comes & goes; various parts of my body" (02/20/2015)   Hyperlipidemia    PONV (postoperative nausea and  vomiting)     Past Surgical History:  Procedure Laterality Date   BACK SURGERY     BLADDER SUSPENSION  ~ 2005   CHOLECYSTECTOMY  05/24/1971   COLONOSCOPY     COLONOSCOPY W/ BIOPSIES AND POLYPECTOMY     ENDOMETRIAL ABLATION  2006?   EYE SURGERY     HERNIA REPAIR     INCISIONAL HERNIA REPAIR N/A 02/20/2015   Procedure: LAPAROSCOPIC INCISIONAL HERNIA REPAIR WITH MESH;  Surgeon: Coralie Keens, MD;  Location: [facility];  Service: General;  Laterality: N/A;   INSERTION OF MESH N/A 02/20/2015   Procedure: INSERTION OF MESH;  Surgeon: Coralie Keens, MD;  Location: [place];  Service: General;  Laterality: N/A;   LAPAROSCOPIC INCISIONAL / UMBILICAL / [place]  02/20/2015   incisional w/mesh   LUMBAR [place] SUR[facility]ERY  05/24/1991   MOLE REMOVAL  01/22/1999   "off my back; no cancer"   TONSILLECTOMY  05/23/1957   TUBAL LIGATION  05/23/1977    Social History LORENIA HOSTON  reports that she has never smoked. She has never used smokeless tobacco. She reports that she does not currently use alcohol. She reports that she does not use drugs.  family history includes Anxiety disorder in her mother; COPD in her father and mother; Colon cancer in her father; Colon polyps in her father; Dementia in her father; Depression in her mother; Diabetes in her paternal grandmother; Heart attack (age of onset: 65) in her brother; Mood Disorder in her son; Prostate cancer in her father; Stroke in her paternal grandfather and paternal grandmother.  Allergies  Allergen Reactions   Penicillins Hives   Rosuvastatin Calcium Other (See Comments)    Muscle pain, weakness in limbs   Statins Other (See Comments)    Muscle pain, weakness in limbs       PHYSICAL EXAMINATION: Vital signs: BP (!) 152/84    Pulse (!) 107    Ht 5\' 3"  (1.6 m)    Wt 182 lb (82.6 kg)    SpO2 98%    BMI 32.24 kg/m   Constitutional: generally well-appearing, no acute distress Psychiatric: alert and oriented x3,  cooperative Eyes: extraocular movements intact, anicteric, conjunctiva pink Mouth: oral pharynx moist, no lesions Neck: supple no lymphadenopathy Cardiovascular: heart regular rate and rhythm, no murmur Lungs: clear to auscultation bilaterally Abdomen: soft, mild discomfort in the left lower quadrant without rebound or guarding, nondistended, no obvious ascites, no peritoneal signs, normal bowel sounds, no organomegaly Rectal: Deferred until colonoscopy Extremities: no clubbing, cyanosis, or lower extremity edema bilaterally Skin: no lesions on visible extremities Neuro: No focal deficits.  Cranial nerves intact  ASSESSMENT:  1.  Recurrent diverticulitis.  Some mild residual discomfort at this time. 2.  Family history of colon cancer.  Multiple prior colonoscopies.  No neoplasia.  Pandiverticulosis 3.  Mild constipation   PLAN:  1.  Recommend one half dose of MiraLAX daily.  Titrate to achieve daily bowel movements. 2.  Prescribe ciprofloxacin 500 mg p.o. twice daily x10 days and metronidazole 500 mg p.o. twice daily for 10 days.  This to treat ongoing residual discomfort.  1 refill provided (she should contact the office if she feels she has attack of diverticulitis and initiates antibiotics.  She agrees). 3.  Colonoscopy to provide follow-up high risk colon cancer screening.  Would wait 6 weeks to assure complete resolution of her diverticulitis.The nature of the procedure, as well as the risks, benefits, and alternatives were carefully and thoroughly reviewed with the patient. Ample time for discussion and questions allowed. The patient understood, was satisfied, and agreed to proceed.  A total time of 45 minutes was spent preparing to see the patient, reviewing laboratories and x-rays, reviewing endoscopy reports, obtaining comprehensive history, performing medically appropriate physical exam, counseling the patient regarding her above listed issues, ordering medications, ordering  endoscopic procedures, and documenting clinical information in the health record

## 2021-06-24 NOTE — Patient Instructions (Signed)
If you are age 70 or older, your body mass index should be between 23-30. Your Body mass index is 32.24 kg/m. If this is out of the aforementioned range listed, please consider follow up with your Primary Care Provider.  If you are age 24 or younger, your body mass index should be between 19-25. Your Body mass index is 32.24 kg/m. If this is out of the aformentioned range listed, please consider follow up with your Primary Care Provider.   ________________________________________________________  The [facility] providers would like to encourage you to use [facility] to communicate with providers for non-urgent requests or questions.  Due to long hold times on the telephone, sending your provider a message by [facility] may be a faster and more efficient way to get a response.  Please allow 48 business hours for a response.  Please remember that this is for non-urgent requests.  _______________________________________________________  We have sent the following medications to your pharmacy for you to pick up at your convenience:  Cipro and Flagyl  Take Miralax daily - 1/2 to 1 dose

## 2021-06-30 DIAGNOSIS — Z961 Presence of intraocular lens: Secondary | ICD-10-CM | POA: Diagnosis not present

## 2021-06-30 DIAGNOSIS — D3132 Benign neoplasm of left choroid: Secondary | ICD-10-CM | POA: Diagnosis not present

## 2021-06-30 DIAGNOSIS — H40013 Open angle with borderline findings, low risk, bilateral: Secondary | ICD-10-CM | POA: Diagnosis not present

## 2021-06-30 DIAGNOSIS — H5203 Hypermetropia, bilateral: Secondary | ICD-10-CM | POA: Diagnosis not present

## 2021-07-15 ENCOUNTER — Other Ambulatory Visit: Payer: Self-pay | Admitting: Family Medicine

## 2021-07-15 DIAGNOSIS — I10 Essential (primary) hypertension: Secondary | ICD-10-CM

## 2021-07-23 ENCOUNTER — Ambulatory Visit: Payer: Medicare Other

## 2021-07-27 ENCOUNTER — Encounter: Payer: Medicare Other | Admitting: Internal Medicine

## 2021-07-27 ENCOUNTER — Encounter: Payer: Self-pay | Admitting: Internal Medicine

## 2021-07-30 ENCOUNTER — Encounter: Payer: Self-pay | Admitting: Internal Medicine

## 2021-08-04 ENCOUNTER — Ambulatory Visit (AMBULATORY_SURGERY_CENTER): Payer: Medicare Other | Admitting: Internal Medicine

## 2021-08-04 ENCOUNTER — Encounter: Payer: Self-pay | Admitting: Internal Medicine

## 2021-08-04 VITALS — BP 116/63 | HR 71 | Temp 98.0°F | Resp 15 | Ht 63.0 in | Wt 182.0 lb

## 2021-08-04 DIAGNOSIS — K5732 Diverticulitis of large intestine without perforation or abscess without bleeding: Secondary | ICD-10-CM | POA: Diagnosis not present

## 2021-08-04 DIAGNOSIS — Z800 Family history of malignant neoplasm of digestive organs: Secondary | ICD-10-CM

## 2021-08-04 DIAGNOSIS — I10 Essential (primary) hypertension: Secondary | ICD-10-CM | POA: Diagnosis not present

## 2021-08-04 DIAGNOSIS — R935 Abnormal findings on diagnostic imaging of other abdominal regions, including retroperitoneum: Secondary | ICD-10-CM

## 2021-08-04 DIAGNOSIS — K5900 Constipation, unspecified: Secondary | ICD-10-CM

## 2021-08-04 DIAGNOSIS — E785 Hyperlipidemia, unspecified: Secondary | ICD-10-CM | POA: Diagnosis not present

## 2021-08-04 MED ORDER — SODIUM CHLORIDE 0.9 % IV SOLN: 500.0000 mL | Freq: Once | INTRAVENOUS | Status: DC

## 2021-08-04 NOTE — Op Note (Signed)
[place] ?Patient Name: Maria Briggs ?Procedure Date: 08/04/2021 10:50 AM ?MRN: [id number] ?Endoscopist: Docia Chuck. [name] , MD ?Age: 71 ?Referring MD:  ?Date of Birth: February 27, 1951 ?Gender: Female ?Account #: [id number] ?Procedure:                Colonoscopy ?Indications:              High risk colon cancer surveillance: Personal  ?                          history of non-advanced adenoma. Father with colon  ?                          cancer listed age 59. Recent bout of diverticulitis  ?                          with corresponding abnormal CT. Previous  ?                          examinations 1993, 2003, 2007, 2013, 2018 ?Medicines:                Monitored Anesthesia Care ?Procedure:                Pre-Anesthesia Assessment: ?                          - Prior to the procedure, a History and Physical  ?                          was performed, and patient medications and  ?                          allergies were reviewed. The patient's tolerance of  ?                          previous anesthesia was also reviewed. The risks  ?                          and benefits of the procedure and the sedation  ?                          options and risks were discussed with the patient.  ?                          All questions were answered, and informed consent  ?                          was obtained. Prior Anticoagulants: The patient has  ?                          taken no previous anticoagulant or antiplatelet  ?                          agents. ASA Grade Assessment: II - A patient with  ?  mild systemic disease. After reviewing the risks  ?                          and benefits, the patient was deemed in  ?                          satisfactory condition to undergo the procedure. ?                          After obtaining informed consent, the colonoscope  ?                          was passed under direct vision. Throughout the  ?                          procedure, the patient's blood  pressure, pulse, and  ?                          oxygen saturations were monitored continuously. The  ?                          0405 PCF-H190TL Slim SB Colonoscope was introduced  ?                          through the anus and advanced to the the cecum,  ?                          identified by appendiceal orifice and ileocecal  ?                          valve. The ileocecal valve, appendiceal orifice,  ?                          and rectum were photographed. The quality of the  ?                          bowel preparation was good. The colonoscopy was  ?                          performed without difficulty. The patient tolerated  ?                          the procedure well. The bowel preparation used was  ?                          SUPREP via split dose instruction. ?Scope In: 11:11:16 AM ?Scope Out: 11:24:14 AM ?Scope Withdrawal Time: 0 hours 10 minutes 3 seconds  ?Total Procedure Duration: 0 hours 12 minutes 58 seconds  ?Findings:                 Many small and large-mouthed diverticula were found  ?                          in the entire colon. There was rectosigmoid  ?  stenosis. This required exchange from standard  ?                          adult colonoscope to the ultrathin colonoscope. ?                          The exam was otherwise without abnormality on  ?                          direct and retroflexion views. ?Complications:            No immediate complications. Estimated blood loss:  ?                          None. ?Estimated Blood Loss:     Estimated blood loss: none. ?Impression:               - Diverticulosis in the entire examined colon. ?                          - Rectosigmoid stenosis ?                          - The examination was otherwise normal on direct  ?                          and retroflexion views. ?                          - No specimens collected. ?Recommendation:           - Repeat colonoscopy in 5 years for surveillance  ?                           (ULTRATHIN COLONOSCOPE). ?                          - Patient has a contact number available for  ?                          emergencies. The signs and symptoms of potential  ?                          delayed complications were discussed with the  ?                          patient. Return to normal activities tomorrow.  ?                          Written discharge instructions were provided to the  ?                          patient. ?                          - Resume previous diet. ?                          -  Continue present medications. [place] ?Docia Chuck. [name], MD ?08/04/2021 11:31:41 AM ?This report has been signed electronically. ?

## 2021-08-04 NOTE — Patient Instructions (Signed)
Please read handouts provided. ?Continue present medications. ?Continue MiraLax. ?Repeat colonoscopy in 5 years for screening. ? ? ? ?YOU HAD AN ENDOSCOPIC PROCEDURE TODAY AT [facility]:   Refer to the procedure report that was given to you for any specific questions about what was found during the examination.  If the procedure report does not answer your questions, please call your gastroenterologist to clarify.  If you requested that your care partner not be given the details of your procedure findings, then the procedure report has been included in a sealed envelope for you to review at your convenience later. ? ?YOU SHOULD EXPECT: Some feelings of bloating in the abdomen. Passage of more gas than usual.  Walking can help get rid of the air that was put into your GI tract during the procedure and reduce the bloating. If you had a lower endoscopy (such as a colonoscopy or flexible sigmoidoscopy) you may notice spotting of blood in your stool or on the toilet paper. If you underwent a bowel prep for your procedure, you may not have a normal bowel movement for a few days. ? ?Please Note:  You might notice some irritation and congestion in your nose or some drainage.  This is from the oxygen used during your procedure.  There is no need for concern and it should clear up in a day or so. ? ?SYMPTOMS TO REPORT IMMEDIATELY: ? ?Following lower endoscopy (colonoscopy or flexible sigmoidoscopy): ? Excessive amounts of blood in the stool ? Significant tenderness or worsening of abdominal pains ? Swelling of the abdomen that is new, acute ? Fever of 100?F or higher ? ? ?For urgent or emergent issues, a gastroenterologist can be reached at any hour by calling [phone]. ?Do not use MyChart messaging for urgent concerns.  ? ? ?DIET:  We do recommend a small meal at first, but then you may proceed to your regular diet.  Drink plenty of fluids but you should avoid alcoholic beverages for 24  hours. ? ?ACTIVITY:  You should plan to take it easy for the rest of today and you should NOT DRIVE or use heavy machinery until tomorrow (because of the sedation medicines used during the test).   ? ?FOLLOW UP: ?Our staff will call the number listed on your records 48-72 hours following your procedure to check on you and address any questions or concerns that you may have regarding the information given to you following your procedure. If we do not reach you, we will leave a message.  We will attempt to reach you two times.  During this call, we will ask if you have developed any symptoms of COVID 19. If you develop any symptoms (ie: fever, flu-like symptoms, shortness of breath, cough etc.) before then, please call [phone].  If you test positive for Covid 19 in the 2 weeks post procedure, please call and report this information to [place].   ? ?If any biopsies were taken you will be contacted by phone or by letter within the next 1-3 weeks.  Please call us at [phone] if you have not heard about the biopsies in 3 weeks.  ? ? ?SIGNATURES/CONFIDENTIALITY: ?You and/or your care partner have signed paperwork which will be entered into your electronic medical record.  These signatures attest to the fact that that the information above on your After Visit Summary has been reviewed and is understood.  Full responsibility of the confidentiality of this discharge information lies with you and/or your care-partner.  ?

## 2021-08-04 NOTE — Progress Notes (Signed)
Pt's states no medical or surgical changes since previsit or office visit. VS assessed by C.W 

## 2021-08-04 NOTE — Progress Notes (Signed)
Report to PACU, RN, vss, BBS= Clear.  

## 2021-08-04 NOTE — Progress Notes (Signed)
HISTORY OF PRESENT ILLNESS: ?  ?Maria Briggs is a 71 y.o. female with minimal past medical history as listed below who presents today at the encouragement of her PCP, upon referral, regarding recurrent diverticulitis.  Patient has been seen on multiple occasions for screening colonoscopy.  There is a family history of colon cancer in her father less than age 46.  Previous examinations have been performed in 1993, 2003, 2007, 2013, and most recently 2018.  Her most recent examination revealed pandiverticulosis.  No neoplasia.  Follow-up in 5 years recommended.  Patient tells me that she developed problems with left-sided abdominal discomfort in the early part of last year.  This resolved spontaneously.  Blood work from August 2022 was unremarkable.  Normal comprehensive metabolic panel.  She developed a subsequent episode in September 2022.  This was worse.  CT scan of the abdomen and pelvis February 03, 2021 revealed stranding around the sigmoid colon consistent with acute diverticulitis.  She was treated with antibiotics and improved.  She was seen again early January 2023 regarding recurrent left lower quadrant pain similar to her previous bouts.  She was treated with ciprofloxacin and metronidazole for about a week.  Symptoms mostly improved.  Due to some residual discomfort she took an antibiotic that she had at home (from her urologist, type uncertain).  Now she states that her discomfort is much better.  She still feels some mild discomfort.  She is allergic to penicillin.  She tolerated Cipro and Flagyl.  She does have some mild chronic constipation.  She is worried about colon cancer. ?  ?REVIEW OF SYSTEMS: ?  ?All non-GI ROS negative unless otherwise stated in the HPI except for anxiety, visual change, hearing problems ?  ?    ?Past Medical History:  ?Diagnosis Date  ? Arthritis    ?  "maybe a little; comes & goes; various parts of my body" (02/20/2015)  ? Hyperlipidemia    ? PONV (postoperative nausea  and vomiting)    ?  ?  ?     ?Past Surgical History:  ?Procedure Laterality Date  ? BACK SURGERY      ? BLADDER SUSPENSION   ~ 2005  ? CHOLECYSTECTOMY   05/24/1971  ? COLONOSCOPY      ? COLONOSCOPY W/ BIOPSIES AND POLYPECTOMY      ? ENDOMETRIAL ABLATION   2006?  ? EYE SURGERY      ? HERNIA REPAIR      ? INCISIONAL HERNIA REPAIR N/A 02/20/2015  ?  Procedure: LAPAROSCOPIC INCISIONAL HERNIA REPAIR WITH MESH;  Surgeon: Coralie Keens, MD;  Location: [place];  Service: General;  Laterality: N/A;  ? INSERTION OF MESH N/A 02/20/2015  ?  Procedure: INSERTION OF MESH;  Surgeon: Coralie Keens, MD;  Location: [place];  Service: General;  Laterality: N/A;  ? LAPAROSCOPIC INCISIONAL / UMBILICAL / [place]   02/20/2015  ?  incisional w/mesh  ? [place] SURGERY   05/24/1991  ? MOLE REMOVAL   01/22/1999  ?  "off my back; no cancer"  ? TONSILLECTOMY   05/23/1957  ? TUBAL LIGATION   05/23/1977  ?  ?  ?Social History ?[name]  reports that she has never smoked. She has never used smokeless tobacco. She reports that she does not currently use alcohol. She reports that she does not use drugs. ?  ?family history includes Anxiety disorder in her mother; COPD in her father and mother; Colon cancer in her father; Colon polyps  in her father; Dementia in her father; Depression in her mother; Diabetes in her paternal grandmother; Heart attack (age of onset: 62) in her brother; Mood Disorder in her son; Prostate cancer in her father; Stroke in her paternal grandfather and paternal grandmother. ?  ?     ?Allergies  ?Allergen Reactions  ? Penicillins Hives  ? Rosuvastatin Calcium Other (See Comments)  ?    Muscle pain, weakness in limbs  ? Statins Other (See Comments)  ?    Muscle pain, weakness in limbs  ?  ?  ?  ?  ?PHYSICAL EXAMINATION: ?Vital signs: BP (!) 152/84   Pulse (!) 107   Ht '5\' 3"'$  (1.6 m)   Wt 182 lb (82.6 kg)   SpO2 98%   BMI 32.24 kg/m?   ?Constitutional: generally well-appearing, no acute  distress ?Psychiatric: alert and oriented x3, cooperative ?Eyes: extraocular movements intact, anicteric, conjunctiva pink ?Mouth: oral pharynx moist, no lesions ?Neck: supple no lymphadenopathy ?Cardiovascular: heart regular rate and rhythm, no murmur ?Lungs: clear to auscultation bilaterally ?Abdomen: soft, mild discomfort in the left lower quadrant without rebound or guarding, nondistended, no obvious ascites, no peritoneal signs, normal bowel sounds, no organomegaly ?Rectal: Deferred until colonoscopy ?Extremities: no clubbing, cyanosis, or lower extremity edema bilaterally ?Skin: no lesions on visible extremities ?Neuro: No focal deficits.  Cranial nerves intact ?  ?ASSESSMENT: ?  ?1.  Recurrent diverticulitis.  Some mild residual discomfort at this time. ?2.  Family history of colon cancer.  Multiple prior colonoscopies.  No neoplasia.  Pandiverticulosis ?3.  Mild constipation ?  ?  ?PLAN: ?  ?1.  Recommend one half dose of MiraLAX daily.  Titrate to achieve daily bowel movements. ?2.  Prescribe ciprofloxacin 500 mg p.o. twice daily x10 days and metronidazole 500 mg p.o. twice daily for 10 days.  This to treat ongoing residual discomfort.  1 refill provided (she should contact the office if she feels she has attack of diverticulitis and initiates antibiotics.  She agrees). ?3.  Colonoscopy to provide follow-up high risk colon cancer screening.  Would wait 6 weeks to assure complete resolution of her diverticulitis.The nature of the procedure, as well as the risks, benefits, and alternatives were carefully and thoroughly reviewed with the patient. Ample time for discussion and questions allowed. The patient understood, was satisfied, and agreed to proceed. ? ?The patient was seen in the office June 24, 2021 as outlined above.  She presents today for colonoscopy.  No interval clinical change.  Feeling better after treatment for diverticulitis ?

## 2021-08-06 ENCOUNTER — Telehealth: Payer: Self-pay

## 2021-08-06 NOTE — Telephone Encounter (Signed)
?  Follow up Call- ? ?Call back number 08/04/2021  ?Post procedure Call Back phone  # [phone]  ?Permission to leave phone message Yes  ?Some recent data might be hidden  ?  ? ?Patient questions: ? ?Do you have a fever, pain , or abdominal swelling? No. ?Pain Score  0 * ? ?Have you tolerated food without any problems? Yes.   ? ?Have you been able to return to your normal activities? Yes.   ? ?Do you have any questions about your discharge instructions: ?Diet   No. ?Medications  No. ?Follow up visit  No. ? ?Do you have questions or concerns about your Care? No. ? ?Actions: ?* If pain score is 4 or above: ?No action needed, pain <4. ? ? ?

## 2021-08-11 ENCOUNTER — Encounter: Payer: Self-pay | Admitting: Family Medicine

## 2021-08-16 DIAGNOSIS — Z1231 Encounter for screening mammogram for malignant neoplasm of breast: Secondary | ICD-10-CM | POA: Diagnosis not present

## 2021-08-17 ENCOUNTER — Encounter: Payer: Self-pay | Admitting: Internal Medicine

## 2021-08-17 ENCOUNTER — Ambulatory Visit (INDEPENDENT_AMBULATORY_CARE_PROVIDER_SITE_OTHER): Payer: Medicare Other

## 2021-08-17 VITALS — Wt 180.0 lb

## 2021-08-17 DIAGNOSIS — R109 Unspecified abdominal pain: Secondary | ICD-10-CM

## 2021-08-17 DIAGNOSIS — Z0000 Encounter for general adult medical examination without abnormal findings: Secondary | ICD-10-CM

## 2021-08-17 MED ORDER — DICYCLOMINE HCL 10 MG PO CAPS: 10.0000 mg | ORAL_CAPSULE | ORAL | 2 refills | Status: DC | PRN

## 2021-08-17 NOTE — Telephone Encounter (Signed)
Yes, I was aware of that. ?With ongoing complaints, lets make sure no interval issues. ?

## 2021-08-17 NOTE — Patient Instructions (Signed)
Maria Briggs , ?Thank you for taking time to come for your Medicare Wellness Visit. I appreciate your ongoing commitment to your health goals. Please review the following plan we discussed and let me know if I can assist you in the future.  ? ?Screening recommendations/referrals: ?Colonoscopy: Done 08/04/2021 - Repeat in 5 years ?Mammogram: Done 08/16/2021 - Repeat annually  ?Bone Density: Done 07/31/2020 - Repeat every 2 years ?Recommended yearly ophthalmology/optometry visit for glaucoma screening and checkup ?Recommended yearly dental visit for hygiene and checkup ? ?Vaccinations: ?Influenza vaccine: Done 03/04/2021 - Repeat annually ?Pneumococcal vaccine: Pneumovax-23 done 02/15/2018; Prevnar-13 due* ?Tdap vaccine: Done 07/15/2020 - Repeat in 10 years ?Shingles vaccine: Zostavax done - Shingrix due* is 2 doses 2-6 months apart and over 90% effective     ?Covid-19: Done 06/12/2019, 07/03/2019, 04/01/2020, & 04/07/2021 ? ?Advanced directives: Please bring a copy of your health care power of attorney and living will to the office to be added to your chart at your convenience.  ? ?Conditions/risks identified: Aim for 30 minutes of exercise or brisk walking, 6-8 glasses of water, and 5 servings of fruits and vegetables each day.  ? ?Next appointment: Follow up in one year for your annual wellness visit  ? ? ?Preventive Care 27 Years and Older, Female ?Preventive care refers to lifestyle choices and visits with your health care provider that can promote health and wellness. ?What does preventive care include? ?A yearly physical exam. This is also called an annual well check. ?Dental exams once or twice a year. ?Routine eye exams. Ask your health care provider how often you should have your eyes checked. ?Personal lifestyle choices, including: ?Daily care of your teeth and gums. ?Regular physical activity. ?Eating a healthy diet. ?Avoiding tobacco and drug use. ?Limiting alcohol use. ?Practicing safe sex. ?Taking low-dose  aspirin every day. ?Taking vitamin and mineral supplements as recommended by your health care provider. ?What happens during an annual well check? ?The services and screenings done by your health care provider during your annual well check will depend on your age, overall health, lifestyle risk factors, and family history of disease. ?Counseling  ?Your health care provider may ask you questions about your: ?Alcohol use. ?Tobacco use. ?Drug use. ?Emotional well-being. ?Home and relationship well-being. ?Sexual activity. ?Eating habits. ?History of falls. ?Memory and ability to understand (cognition). ?Work and work Statistician. ?Reproductive health. ?Screening  ?You may have the following tests or measurements: ?Height, weight, and BMI. ?Blood pressure. ?Lipid and cholesterol levels. These may be checked every 5 years, or more frequently if you are over 66 years old. ?Skin check. ?Lung cancer screening. You may have this screening every year starting at age 49 if you have a 30-pack-year history of smoking and currently smoke or have quit within the past 15 years. ?Fecal occult blood test (FOBT) of the stool. You may have this test every year starting at age 28. ?Flexible sigmoidoscopy or colonoscopy. You may have a sigmoidoscopy every 5 years or a colonoscopy every 10 years starting at age 57. ?Hepatitis C blood test. ?Hepatitis B blood test. ?Sexually transmitted disease (STD) testing. ?Diabetes screening. This is done by checking your blood sugar (glucose) after you have not eaten for a while (fasting). You may have this done every 1-3 years. ?Bone density scan. This is done to screen for osteoporosis. You may have this done starting at age 27. ?Mammogram. This may be done every 1-2 years. Talk to your health care provider about how often you should have regular  mammograms. ?Talk with your health care provider about your test results, treatment options, and if necessary, the need for more tests. ?Vaccines  ?Your  health care provider may recommend certain vaccines, such as: ?Influenza vaccine. This is recommended every year. ?Tetanus, diphtheria, and acellular pertussis (Tdap, Td) vaccine. You may need a Td booster every 10 years. ?Zoster vaccine. You may need this after age 34. ?Pneumococcal 13-valent conjugate (PCV13) vaccine. One dose is recommended after age 25. ?Pneumococcal polysaccharide (PPSV23) vaccine. One dose is recommended after age 56. ?Talk to your health care provider about which screenings and vaccines you need and how often you need them. ?This information is not intended to replace advice given to you by your health care provider. Make sure you discuss any questions you have with your health care provider. ?Document Released: 06/05/2015 Document Revised: 01/27/2016 Document Reviewed: 03/10/2015 ?Elsevier Interactive Patient Education ? 2017 [place]. ? ?Fall Prevention in the Home ?Falls can cause injuries. They can happen to people of all ages. There are many things you can do to make your home safe and to help prevent falls. ?What can I do on the outside of my home? ?Regularly fix the edges of walkways and driveways and fix any cracks. ?Remove anything that might make you trip as you walk through a door, such as a raised step or threshold. ?Trim any bushes or trees on the path to your home. ?Use bright outdoor lighting. ?Clear any walking paths of anything that might make someone trip, such as rocks or tools. ?Regularly check to see if handrails are loose or broken. Make sure that both sides of any steps have handrails. ?Any raised decks and porches should have guardrails on the edges. ?Have any leaves, snow, or ice cleared regularly. ?Use sand or salt on walking paths during winter. ?Clean up any spills in your garage right away. This includes oil or grease spills. ?What can I do in the bathroom? ?Use night lights. ?Install grab bars by the toilet and in the tub and shower. Do not use towel bars as  grab bars. ?Use non-skid mats or decals in the tub or shower. ?If you need to sit down in the shower, use a plastic, non-slip stool. ?Keep the floor dry. Clean up any water that spills on the floor as soon as it happens. ?Remove soap buildup in the tub or shower regularly. ?Attach bath mats securely with double-sided non-slip rug tape. ?Do not have throw rugs and other things on the floor that can make you trip. ?What can I do in the bedroom? ?Use night lights. ?Make sure that you have a light by your bed that is easy to reach. ?Do not use any sheets or blankets that are too big for your bed. They should not hang down onto the floor. ?Have a firm chair that has side arms. You can use this for support while you get dressed. ?Do not have throw rugs and other things on the floor that can make you trip. ?What can I do in the kitchen? ?Clean up any spills right away. ?Avoid walking on wet floors. ?Keep items that you use a lot in easy-to-reach places. ?If you need to reach something above you, use a strong step stool that has a grab bar. ?Keep electrical cords out of the way. ?Do not use floor polish or wax that makes floors slippery. If you must use wax, use non-skid floor wax. ?Do not have throw rugs and other things on the floor that can  make you trip. ?What can I do with my stairs? ?Do not leave any items on the stairs. ?Make sure that there are handrails on both sides of the stairs and use them. Fix handrails that are broken or loose. Make sure that handrails are as long as the stairways. ?Check any carpeting to make sure that it is firmly attached to the stairs. Fix any carpet that is loose or worn. ?Avoid having throw rugs at the top or bottom of the stairs. If you do have throw rugs, attach them to the floor with carpet tape. ?Make sure that you have a light switch at the top of the stairs and the bottom of the stairs. If you do not have them, ask someone to add them for you. ?What else can I do to help prevent  falls? ?Wear shoes that: ?Do not have high heels. ?Have rubber bottoms. ?Are comfortable and fit you well. ?Are closed at the toe. Do not wear sandals. ?If you use a stepladder: ?Make sure that it is fully o

## 2021-08-17 NOTE — Telephone Encounter (Signed)
Dr [name] the pt is asking if she needs another CT since she had one in September 2022. Please advise  ?

## 2021-08-17 NOTE — Telephone Encounter (Signed)
Symptoms possibly due to narrowing from diverticular disease or diverticular spasm. ?1.  Continue MiraLAX to avoid constipation ?2.  Obtain contrast CT scan of the abdomen and pelvis to rule out other causes or problems for persistent left-sided pain ?3.  Prescribe Bentyl 10 mg.  May take 1 or 2 every 4-6 hours as needed for pain (significant pain).  #30; 2 refills ?4.  Office follow-up with Dr. [name] in 3 months ? ?Dr. [name] ?

## 2021-08-17 NOTE — Progress Notes (Signed)
? ?Subjective:  ? Maria Briggs is a 71 y.o. female who presents for Medicare Annual (Subsequent) preventive examination. ? ?Virtual Visit via Telephone Note ? ?I connected with  Maria Briggs on 08/17/21 at  9:00 AM EDT by telephone and verified that I am speaking with the correct person using two identifiers. ? ?Location: ?Patient: Home ?Provider: [facility] ?Persons participating in the virtual visit: patient/Nurse Health Advisor ?  ?I discussed the limitations, risks, security and privacy concerns of performing an evaluation and management service by telephone and the availability of in person appointments. The patient expressed understanding and agreed to proceed. ? ?Interactive audio and video telecommunications were attempted between this nurse and patient, however failed, due to patient having technical difficulties OR patient did not have access to video capability.  We continued and completed visit with audio only. ? ?Some vital signs may be absent or patient reported.  ? ?Maria [place] Dionne Ano, LPN  ? ?Review of Systems    ? ?Cardiac Risk Factors include: advanced age (>50mn, >>82women);obesity (BMI >30kg/m2);sedentary lifestyle;hypertension;dyslipidemia;Other (see comment), Risk factor comments: aortic atherosclerosis ? ?   ?Objective:  ?  ?Today's Vitals  ? 08/17/21 0903  ?Weight: 180 lb (81.6 kg)  ?PainSc: 2   ? ?Body mass index is 31.89 kg/m?. ? ? ?  08/17/2021  ?  9:29 AM 07/22/2020  ?  9:07 AM 04/08/2019  ? 12:52 PM 03/24/2017  ?  7:51 AM 02/20/2015  ? 10:27 PM 02/11/2015  ?  3:04 PM  ?Advanced Directives  ?Does Patient Have a Medical Advance Directive? Yes Yes Yes Yes Yes Yes  ?Type of AParamedicof [place]ong[place] will [place]Living will;Out of facility DNR (pink MOST or yellow form)  [place]ou[place]Living will Living will;Healthcare Power of Attorney Living will;Healthcare Power of Attorney  ?Does patient want to make changes to medical advance  directive?  No - Patient declined   No - Patient declined No - Patient declined  ?Copy of [place] Chart? No - copy requested No - copy requested   No - copy requested No - copy requested  ? ? ?Current Medications (verified) ?Outpatient Encounter Medications as of 08/17/2021  ?Medication Sig  ? amLODipine (NORVASC) 5 MG tablet Take 1 tablet (5 mg total) by mouth daily. (NEEDS TO BE SEEN BEFORE NEXT REFILL)  ? aspirin 81 MG tablet Take 81 mg by mouth daily.  ? Biotin 5 MG CAPS Take 1 capsule by mouth daily.   ? estradiol (ESTRACE) 0.1 MG/GM vaginal cream Place 1 Applicatorful vaginally every other day.  ? ezetimibe (ZETIA) 10 MG tablet Take 10 mg by mouth daily.  ? meloxicam (MOBIC) 15 MG tablet TAKE 0.5-1 TABLETS (7.5-15 MG TOTAL) BY MOUTH DAILY.  ? minoxidil (ROGAINE) 2 % external solution Apply topically 2 (two) times daily.  ? Omega-3 Fatty Acids (FISH OIL PO) Take 1,000 mg by mouth 2 (two) times daily.  ? polyethylene glycol (MIRALAX) 0.34 gm/ml SOLN   ? [DISCONTINUED] Bempedoic Acid-Ezetimibe (NEXLIZET) 180-10 MG TABS Take 1 tablet by mouth daily. (Patient not taking: Reported on 08/04/2021)  ? [DISCONTINUED] CALCIUM PO Take 600 mg by mouth daily.  ? [DISCONTINUED] ciprofloxacin (CIPRO) 500 MG tablet Take 1 tablet (500 mg total) by mouth 2 (two) times daily.  ? [DISCONTINUED] metroNIDAZOLE (FLAGYL) 500 MG tablet Take 1 tablet (500 mg total) by mouth 2 (two) times daily. (Patient not taking: Reported on 08/04/2021)  ? [DISCONTINUED] ondansetron (ZOFRAN) 4  MG tablet Take 1 tablet about 30 minutes before each half of the colonoscopy prep  ? ?No facility-administered encounter medications on file as of 08/17/2021.  ? ? ?Allergies (verified) ?Penicillins, Rosuvastatin calcium, and Statins  ? ?History: ?Past Medical History:  ?Diagnosis Date  ? Allergy   ? Arthritis   ? "maybe a little; comes & goes; various parts of my body" (02/20/2015)  ? Hyperlipidemia   ? Hypertension   ? PONV (postoperative  nausea and vomiting)   ? ?Past Surgical History:  ?Procedure Laterality Date  ? BACK SURGERY    ? BLADDER SUSPENSION  ~ 2005  ? CHOLECYSTECTOMY  05/24/1971  ? COLONOSCOPY    ? COLONOSCOPY W/ BIOPSIES AND POLYPECTOMY    ? ENDOMETRIAL ABLATION  2006?  ? EYE SURGERY    ? HERNIA REPAIR    ? INCISIONAL HERNIA REPAIR N/A 02/20/2015  ? Procedure: LAPAROSCOPIC INCISIONAL HERNIA REPAIR WITH MESH;  Surgeon: Coralie Keens, MD;  Location: [place];  Service: General;  Laterality: N/A;  ? INSERTION OF MESH N/A 02/20/2015  ? Procedure: INSERTION OF MESH;  Surgeon: Coralie Keens, MD;  Location: [place];  Service: General;  Laterality: N/A;  ? LAPAROSCOPIC INCISIONAL / UMBILICAL / [place]  02/20/2015  ? incisional w/mesh  ? [place] SURGERY  05/24/1991  ? MOLE REMOVAL  01/22/1999  ? "off my back; no cancer"  ? SPINE SURGERY    ? Ruptured disc lower back  ? TONSILLECTOMY  05/23/1957  ? TUBAL LIGATION  05/23/1977  ? ?Family History  ?Problem Relation Age of Onset  ? Colon polyps Father   ? Colon cancer Father   ?     ?  ? Prostate cancer Father   ? Dementia Father   ? COPD Father   ? Cancer Father   ? Anxiety disorder Mother   ? COPD Mother   ? Depression Mother   ? Heart attack Brother 44  ? Mood Disorder Son   ? Stroke Maternal Grandfather   ? Stroke Paternal Grandmother   ? Diabetes Paternal Grandmother   ? Stroke Paternal Grandfather   ? Diabetes Paternal Grandfather   ? Esophageal cancer Neg Hx   ? Stomach cancer Neg Hx   ? Rectal cancer Neg Hx   ? ?Social History  ? ?Socioeconomic History  ? Marital status: Married  ?  Spouse name: Maria Briggs  ? Number of children: 2  ? Years of education: Not on file  ? Highest education level: High school graduate  ?Occupational History  ? Occupation: retired  ?Tobacco Use  ? Smoking status: Never  ? Smokeless tobacco: Never  ?Vaping Use  ? Vaping Use: Never used  ?Substance and Sexual Activity  ? Alcohol use: Not Currently  ?  Comment: 02/20/2015 "might have a drink q 6 months"   ? Drug use: No  ? Sexual activity: Not Currently  ?Other Topics Concern  ? Not on file  ?Social History Narrative  ? Retired, lives with husband Maria Briggs, 2 children, enjoys reading and puzzles  ? ?Social Determinants of Health  ? ?Financial Resource Strain: Low Risk   ? Difficulty of Paying Living Expenses: Not hard at all  ?Food Insecurity: No Food Insecurity  ? Worried About Charity fundraiser in the Last Year: Never true  ? Ran Out of Food in the Last Year: Never true  ?Transportation Needs: No Transportation Needs  ? Lack of Transportation (Medical): No  ? Lack of Transportation (Non-Medical): No  ?Physical  Activity: Insufficiently Active  ? Days of Exercise per Week: 7 days  ? Minutes of Exercise per Session: 20 min  ?Stress: No Stress Concern Present  ? Feeling of Stress : Only a little  ?Social Connections: Socially Integrated  ? Frequency of Communication with Friends and Family: More than three times a week  ? Frequency of Social Gatherings with Friends and Family: Once a week  ? Attends Religious Services: 1 to 4 times per year  ? Active Member of Clubs or Organizations: Yes  ? Attends Archivist Meetings: 1 to 4 times per year  ? Marital Status: Married  ? ? ?Tobacco Counseling ?Counseling given: Not Answered ? ? ?Clinical Intake: ? ?Pre-visit preparation completed: Yes ? ?Pain : 0-10 ?Pain Score: 2  ?Pain Type: Acute pain ?Pain Location: Abdomen ?Pain Orientation: Mid, Lower ?Pain Descriptors / Indicators: Discomfort ?Pain Onset: In the past 7 days ?Pain Frequency: Intermittent ? ?  ? ?BMI - recorded: 31.89 ?Nutritional Status: BMI > 30  Obese ?Nutritional Risks: None ?Diabetes: No ? ?How often do you need to have someone help you when you read instructions, pamphlets, or other written materials from your doctor or pharmacy?: 1 - Never ? ?Diabetic? no ? ?Interpreter Needed?: No ? ?Information entered by :: Kemyra August, LPN ? ? ?Activities of Daily Living ? ?  08/17/2021  ?  9:29 AM 08/13/2021  ?  10:52 AM  ?In your present state of health, do you have any difficulty performing the following activities:  ?Hearing? 0 0  ?Vision? 0 0  ?Difficulty concentrating or making decisions? 0 0  ?Walking or cli

## 2021-08-18 ENCOUNTER — Other Ambulatory Visit: Payer: Self-pay

## 2021-08-18 DIAGNOSIS — R109 Unspecified abdominal pain: Secondary | ICD-10-CM

## 2021-08-18 NOTE — Telephone Encounter (Signed)
Patient is calling regarding CT scan. Per patient, there is some issues with CT scan. Please advise.  ?

## 2021-08-24 ENCOUNTER — Other Ambulatory Visit: Payer: Self-pay | Admitting: Family Medicine

## 2021-08-30 ENCOUNTER — Ambulatory Visit (INDEPENDENT_AMBULATORY_CARE_PROVIDER_SITE_OTHER): Payer: Medicare Other | Admitting: Family Medicine

## 2021-08-30 ENCOUNTER — Other Ambulatory Visit: Payer: Self-pay | Admitting: Family Medicine

## 2021-08-30 ENCOUNTER — Encounter: Payer: Self-pay | Admitting: Family Medicine

## 2021-08-30 VITALS — BP 181/86 | HR 93 | Temp 98.0°F | Ht 63.0 in | Wt 184.0 lb

## 2021-08-30 DIAGNOSIS — R109 Unspecified abdominal pain: Secondary | ICD-10-CM | POA: Diagnosis not present

## 2021-08-30 DIAGNOSIS — R739 Hyperglycemia, unspecified: Secondary | ICD-10-CM | POA: Diagnosis not present

## 2021-08-30 DIAGNOSIS — M25512 Pain in left shoulder: Secondary | ICD-10-CM | POA: Diagnosis not present

## 2021-08-30 DIAGNOSIS — G8929 Other chronic pain: Secondary | ICD-10-CM

## 2021-08-30 DIAGNOSIS — E669 Obesity, unspecified: Secondary | ICD-10-CM

## 2021-08-30 DIAGNOSIS — I700 Atherosclerosis of aorta: Secondary | ICD-10-CM

## 2021-08-30 DIAGNOSIS — I10 Essential (primary) hypertension: Secondary | ICD-10-CM | POA: Diagnosis not present

## 2021-08-30 DIAGNOSIS — M25511 Pain in right shoulder: Secondary | ICD-10-CM

## 2021-08-30 LAB — URINALYSIS, ROUTINE W REFLEX MICROSCOPIC
Bilirubin, UA: NEGATIVE
Glucose, UA: NEGATIVE
Ketones, UA: NEGATIVE
Nitrite, UA: NEGATIVE
Protein,UA: NEGATIVE
RBC, UA: NEGATIVE
Specific Gravity, UA: 1.015 (ref 1.005–1.030)
Urobilinogen, Ur: 0.2 mg/dL (ref 0.2–1.0)
pH, UA: 7 (ref 5.0–7.5)

## 2021-08-30 LAB — BAYER DCA HB A1C WAIVED: HB A1C (BAYER DCA - WAIVED): 4.8 % (ref 4.8–5.6)

## 2021-08-30 LAB — MICROSCOPIC EXAMINATION
RBC, Urine: NONE SEEN /hpf (ref 0–2)
Renal Epithel, UA: NONE SEEN /hpf

## 2021-08-30 MED ORDER — AMLODIPINE BESYLATE 5 MG PO TABS: 5.0000 mg | ORAL_TABLET | Freq: Every day | ORAL | 0 refills | Status: DC

## 2021-08-30 MED ORDER — MELOXICAM 15 MG PO TABS: 7.5000 mg | ORAL_TABLET | Freq: Every day | ORAL | 2 refills | Status: DC

## 2021-08-30 NOTE — Progress Notes (Signed)
? ?Subjective: ?CC: Follow-up back pain ?PCP: [name], DO ?Maria Briggs is a 71 y.o. female presenting to clinic today for: ? ?1.  Back pain ?Patient reports she continues to have some left side pain that goes into the left hip and back.  This comes and goes daily.  She has a CAT scan scheduled for Monday for the abdomen and pelvis.  She has seen her urologist and was not felt to be secondary to renal dysfunction.  She has some antibiotics at home and wonders if she should go ahead and take these.  She denies any dysuria, hematuria, nausea or vomiting.  The pain does seem to be worse with certain positions.  Her gastroenterologist thought that perhaps some of this was due to diverticular pain.  They got a urinalysis at the urologist but really no other imaging performed. ? ? ?ROS: Per HPI ? ?Allergies  ?Allergen Reactions  ? Penicillins Hives  ? Rosuvastatin Calcium Other (See Comments)  ?  Muscle pain, weakness in limbs  ? Statins Other (See Comments)  ?  Muscle pain, weakness in limbs  ? ?Past Medical History:  ?Diagnosis Date  ? Allergy   ? Arthritis   ? "maybe a little; comes & goes; various parts of my body" (02/20/2015)  ? Hyperlipidemia   ? Hypertension   ? PONV (postoperative nausea and vomiting)   ? ? ?Current Outpatient Medications:  ?  amLODipine (NORVASC) 5 MG tablet, Take 1 tablet (5 mg total) by mouth daily. (NEEDS TO BE SEEN BEFORE NEXT REFILL), Disp: 30 tablet, Rfl: 0 ?  aspirin 81 MG tablet, Take 81 mg by mouth daily., Disp: , Rfl:  ?  Biotin 5 MG CAPS, Take 1 capsule by mouth daily. , Disp: , Rfl:  ?  dicyclomine (BENTYL) 10 MG capsule, Take 1-2 capsules (10-20 mg total) by mouth every 5 (five) hours as needed for spasms., Disp: 30 capsule, Rfl: 2 ?  estradiol (ESTRACE) 0.1 MG/GM vaginal cream, Place 1 Applicatorful vaginally every other day., Disp: , Rfl:  ?  ezetimibe (ZETIA) 10 MG tablet, Take 10 mg by mouth daily., Disp: , Rfl:  ?  meloxicam (MOBIC) 15 MG tablet, TAKE 0.5-1  TABLETS (7.5-15 MG TOTAL) BY MOUTH DAILY., Disp: 30 tablet, Rfl: 2 ?  minoxidil (ROGAINE) 2 % external solution, Apply topically 2 (two) times daily., Disp: , Rfl:  ?  Omega-3 Fatty Acids (FISH OIL PO), Take 1,000 mg by mouth 2 (two) times daily., Disp: , Rfl:  ?  polyethylene glycol (MIRALAX) 0.34 gm/ml SOLN, , Disp: , Rfl:  ?Social History  ? ?Socioeconomic History  ? Marital status: Married  ?  Spouse name: Coralyn Mark  ? Number of children: 2  ? Years of education: Not on file  ? Highest education level: High school graduate  ?Occupational History  ? Occupation: retired  ?Tobacco Use  ? Smoking status: Never  ? Smokeless tobacco: Never  ?Vaping Use  ? Vaping Use: Never used  ?Substance and Sexual Activity  ? Alcohol use: Not Currently  ?  Comment: 02/20/2015 "might have a drink q 6 months"  ? Drug use: No  ? Sexual activity: Not Currently  ?Other Topics Concern  ? Not on file  ?Social History Narrative  ? Retired, lives with husband Coralyn Mark, 2 children, enjoys reading and puzzles  ? ?Social Determinants of Health  ? ?Financial Resource Strain: Low Risk   ? Difficulty of Paying Living Expenses: Not hard at all  ?Food Insecurity: No  Food Insecurity  ? Worried About Charity fundraiser in the Last Year: Never true  ? Ran Out of Food in the Last Year: Never true  ?Transportation Needs: No Transportation Needs  ? Lack of Transportation (Medical): No  ? Lack of Transportation (Non-Medical): No  ?Physical Activity: Insufficiently Active  ? Days of Exercise per Week: 7 days  ? Minutes of Exercise per Session: 20 min  ?Stress: No Stress Concern Present  ? Feeling of Stress : Only a little  ?Social Connections: Socially Integrated  ? Frequency of Communication with Friends and Family: More than three times a week  ? Frequency of Social Gatherings with Friends and Family: Once a week  ? Attends Religious Services: 1 to 4 times per year  ? Active Member of Clubs or Organizations: Yes  ? Attends Archivist Meetings: 1 to 4  times per year  ? Marital Status: Married  ?Intimate Partner Violence: Not At Risk  ? Fear of Current or Ex-Partner: No  ? Emotionally Abused: No  ? Physically Abused: No  ? Sexually Abused: No  ? ?Family History  ?Problem Relation Age of Onset  ? Colon polyps Father   ? Colon cancer Father   ?     ?  ? Prostate cancer Father   ? Dementia Father   ? COPD Father   ? Cancer Father   ? Anxiety disorder Mother   ? COPD Mother   ? Depression Mother   ? Heart attack Brother 49  ? Mood Disorder Son   ? Stroke Maternal Grandfather   ? Stroke Paternal Grandmother   ? Diabetes Paternal Grandmother   ? Stroke Paternal Grandfather   ? Diabetes Paternal Grandfather   ? Esophageal cancer Neg Hx   ? Stomach cancer Neg Hx   ? Rectal cancer Neg Hx   ? ? ?Objective: ?Office vital signs reviewed. ?BP (!) 181/86   Pulse 93   Temp 98 ?F (36.7 ?C)   Ht 5' 3"  (1.6 m)   Wt 184 lb (83.5 kg)   SpO2 96%   BMI 32.59 kg/m?  ? ?Physical Examination:  ?General: Awake, alert, well nourished, No acute distress ?HEENT: Sclera white.  Moist mucous membranes ?GU: No suprapubic tenderness palpation.  No CVA tenderness palpation ?MSK: Normal gait and station ? Lumbar spine: No midline tenderness palpation.  No paraspinal muscle tenderness palpation.  Negative straight leg raise bilaterally. ? Hip: Left hip with negative FADIR and negative FABER.  No tenderness palpation of the trochanteric bursa. ?Neuro: 5/5 Strength and light touch sensation grossly intact  ? ?Assessment/ Plan: ?71 y.o. female  ? ?Left flank pain - Plan: Urinalysis, Routine w reflex microscopic, CBC ? ?Aortic atherosclerosis (HCC) - Plan: Lipid Panel, CMP14+EGFR, TSH ? ?Obesity (BMI 30-39.9) - Plan: Bayer DCA Hb A1c Waived ? ?Essential hypertension - Plan: amLODipine (NORVASC) 5 MG tablet, DISCONTINUED: amLODipine (NORVASC) 5 MG tablet ? ?Chronic pain of both shoulders - Plan: meloxicam (MOBIC) 15 MG tablet ? ?Her urinalysis showed some bacteria but really no other evidence of  infection.  Still unsure as to why she is having this pain.  Her musculoskeletal exam was unremarkable today.  She can go ahead and start taking her antibiotic and see if perhaps that might help.  Await her CT abdomen pelvis.  May need to consider imaging of her lumbar spine as possible etiology. ? ?Fasting labs ordered. ? ?Medications have been renewed as blood pressure is not controlled.  This may be  secondary to lapse in BP med.  She will come back in 2 weeks for blood pressure recheck and if still above 140/90 we will advance Norvasc to 10 mg daily ? ?No orders of the defined types were placed in this encounter. ? ?No orders of the defined types were placed in this encounter. ? ? ? ?[name], DO ?[place] ?[phone] ? ? ?

## 2021-08-31 LAB — CBC
Hematocrit: 44.6 % (ref 34.0–46.6)
Hemoglobin: 14.8 g/dL (ref 11.1–15.9)
MCH: 29.8 pg (ref 26.6–33.0)
MCHC: 33.2 g/dL (ref 31.5–35.7)
MCV: 90 fL (ref 79–97)
Platelets: 219 10*3/uL (ref 150–450)
RBC: 4.96 x10E6/uL (ref 3.77–5.28)
RDW: 13 % (ref 11.7–15.4)
WBC: 4.6 10*3/uL (ref 3.4–10.8)

## 2021-08-31 LAB — CMP14+EGFR
ALT: 17 IU/L (ref 0–32)
AST: 20 IU/L (ref 0–40)
Albumin/Globulin Ratio: 2.5 — ABNORMAL HIGH (ref 1.2–2.2)
Albumin: 4.7 g/dL (ref 3.8–4.8)
Alkaline Phosphatase: 82 IU/L (ref 44–121)
BUN/Creatinine Ratio: 21 (ref 12–28)
BUN: 17 mg/dL (ref 8–27)
Bilirubin Total: 1 mg/dL (ref 0.0–1.2)
CO2: 25 mmol/L (ref 20–29)
Calcium: 9.6 mg/dL (ref 8.7–10.3)
Chloride: 103 mmol/L (ref 96–106)
Creatinine, Ser: 0.81 mg/dL (ref 0.57–1.00)
Globulin, Total: 1.9 g/dL (ref 1.5–4.5)
Glucose: 109 mg/dL — ABNORMAL HIGH (ref 70–99)
Potassium: 4.6 mmol/L (ref 3.5–5.2)
Sodium: 142 mmol/L (ref 134–144)
Total Protein: 6.6 g/dL (ref 6.0–8.5)
eGFR: 79 mL/min/{1.73_m2} (ref >59)

## 2021-08-31 LAB — TSH: TSH: 2.12 u[IU]/mL (ref 0.450–4.500)

## 2021-08-31 LAB — LIPID PANEL
Chol/HDL Ratio: 2.7 ratio (ref 0.0–4.4)
Cholesterol, Total: 258 mg/dL — ABNORMAL HIGH (ref 100–199)
HDL: 94 mg/dL (ref >39)
LDL Chol Calc (NIH): 145 mg/dL — ABNORMAL HIGH (ref 0–99)
Triglycerides: 111 mg/dL (ref 0–149)
VLDL Cholesterol Cal: 19 mg/dL (ref 5–40)

## 2021-09-02 ENCOUNTER — Other Ambulatory Visit: Payer: Self-pay | Admitting: Family Medicine

## 2021-09-06 ENCOUNTER — Other Ambulatory Visit: Payer: Self-pay

## 2021-09-06 ENCOUNTER — Ambulatory Visit (HOSPITAL_COMMUNITY)
Admission: RE | Admit: 2021-09-06 | Discharge: 2021-09-06 | Disposition: A | Discharge status: Home / Self Care | Payer: Medicare Other | Source: Ambulatory Visit | Attending: Internal Medicine | Admitting: Internal Medicine

## 2021-09-06 ENCOUNTER — Telehealth: Payer: Self-pay | Admitting: Family Medicine

## 2021-09-06 DIAGNOSIS — R109 Unspecified abdominal pain: Secondary | ICD-10-CM

## 2021-09-06 DIAGNOSIS — N200 Calculus of kidney: Secondary | ICD-10-CM | POA: Diagnosis not present

## 2021-09-06 MED ORDER — EZETIMIBE 10 MG PO TABS: 10.0000 mg | ORAL_TABLET | Freq: Every day | ORAL | 3 refills | Status: DC

## 2021-09-06 MED ORDER — EZETIMIBE 10 MG PO TABS
10.0000 mg | ORAL_TABLET | Freq: Every day | ORAL | 3 refills | Status: DC
  Filled 2021-09-06: qty 90, 90d supply, fill #0

## 2021-09-06 MED ORDER — IOHEXOL 300 MG/ML  SOLN
100.0000 mL | Freq: Once | INTRAMUSCULAR | Status: AC | PRN
  Administered 2021-09-06: 100 mL via INTRAVENOUS

## 2021-09-06 NOTE — Telephone Encounter (Signed)
Patient aware and sent to [facility]  ?

## 2021-09-13 ENCOUNTER — Telehealth: Payer: Self-pay | Admitting: Emergency Medicine

## 2021-09-13 ENCOUNTER — Ambulatory Visit: Payer: Medicare Other | Admitting: Emergency Medicine

## 2021-09-13 VITALS — BP 131/76

## 2021-09-13 DIAGNOSIS — I10 Essential (primary) hypertension: Secondary | ICD-10-CM

## 2021-09-13 NOTE — Telephone Encounter (Signed)
Patient states that she was supposed to get Zetia through patient assistance and she has not gotten anything and wants to followup.  ? ?Please call her.  ? ?Thank you  ? ?Maria Briggs. LPN   ?

## 2021-09-13 NOTE — Progress Notes (Signed)
Patient presents for Blood Pressure check. 131/76. Patient is doing well.  ? ?Kenyen Candy. LPN  ? ?

## 2021-09-14 ENCOUNTER — Telehealth: Payer: Self-pay | Admitting: Family Medicine

## 2021-09-14 NOTE — Telephone Encounter (Signed)
Pt aware.

## 2021-09-14 NOTE — Telephone Encounter (Signed)
Please let patient know: ? ?Patient needs to pick up health well grant packet up front with her name on it and take it to pharmacy ?It's been waiting on her to pick up here ?We called her in February to come pick up ? ?

## 2021-10-29 ENCOUNTER — Telehealth: Payer: Self-pay | Admitting: Internal Medicine

## 2021-10-29 ENCOUNTER — Encounter: Payer: Self-pay | Admitting: Internal Medicine

## 2021-10-29 NOTE — Telephone Encounter (Signed)
Left message for pt to call back  °

## 2021-10-29 NOTE — Telephone Encounter (Signed)
Patient called and scheduled an appointment with Dr. [name] for 12/14/21.  She is having a lot of pain in her upper stomach and back and says her bowels are just "not right."  She took Miralax everyday, but it caused her to go to the bathroom 9-10 times a day.  She backed it off a bit but is still having issues.  Please call patient and advise.  Thank you.

## 2021-10-29 NOTE — Telephone Encounter (Signed)
Pts appt moved to 11/11/21 at 3:40pm. Pt aware of appt.

## 2021-11-11 ENCOUNTER — Ambulatory Visit: Payer: Medicare Other | Admitting: Internal Medicine

## 2021-11-11 ENCOUNTER — Encounter: Payer: Self-pay | Admitting: Internal Medicine

## 2021-11-11 VITALS — BP 156/84 | HR 88 | Ht 61.75 in | Wt 182.1 lb

## 2021-11-11 DIAGNOSIS — R935 Abnormal findings on diagnostic imaging of other abdominal regions, including retroperitoneum: Secondary | ICD-10-CM

## 2021-11-11 DIAGNOSIS — K5732 Diverticulitis of large intestine without perforation or abscess without bleeding: Secondary | ICD-10-CM

## 2021-11-11 DIAGNOSIS — K5900 Constipation, unspecified: Secondary | ICD-10-CM | POA: Diagnosis not present

## 2021-11-11 DIAGNOSIS — Z800 Family history of malignant neoplasm of digestive organs: Secondary | ICD-10-CM

## 2021-11-11 DIAGNOSIS — R109 Unspecified abdominal pain: Secondary | ICD-10-CM

## 2021-11-11 NOTE — Patient Instructions (Addendum)
If you are age 70 or older, your body mass index should be between 23-30. Your Body mass index is 33.58 kg/m. If this is out of the aforementioned range listed, please consider follow up with your Primary Care Provider.  If you are age 49 or younger, your body mass index should be between 19-25. Your Body mass index is 33.58 kg/m. If this is out of the aformentioned range listed, please consider follow up with your Primary Care Provider.   ________________________________________________________  The [facility] providers would like to encourage you to use [facility] to communicate with providers for non-urgent requests or questions.  Due to long hold times on the telephone, sending your provider a message by [facility] may be a faster and more efficient way to get a response.  Please allow 48 business hours for a response.  Please remember that this is for non-urgent requests.  _______________________________________________________  Please follow up as needed

## 2021-12-05 ENCOUNTER — Other Ambulatory Visit: Payer: Self-pay | Admitting: Family Medicine

## 2021-12-05 DIAGNOSIS — I10 Essential (primary) hypertension: Secondary | ICD-10-CM

## 2021-12-06 ENCOUNTER — Other Ambulatory Visit: Payer: Self-pay | Admitting: Family Medicine

## 2021-12-06 DIAGNOSIS — G8929 Other chronic pain: Secondary | ICD-10-CM

## 2021-12-13 ENCOUNTER — Encounter: Payer: Self-pay | Admitting: Internal Medicine

## 2021-12-14 ENCOUNTER — Ambulatory Visit: Payer: Medicare Other | Admitting: Internal Medicine

## 2021-12-27 DIAGNOSIS — H40013 Open angle with borderline findings, low risk, bilateral: Secondary | ICD-10-CM | POA: Diagnosis not present

## 2021-12-27 DIAGNOSIS — H5203 Hypermetropia, bilateral: Secondary | ICD-10-CM | POA: Diagnosis not present

## 2021-12-27 DIAGNOSIS — D3132 Benign neoplasm of left choroid: Secondary | ICD-10-CM | POA: Diagnosis not present

## 2021-12-27 DIAGNOSIS — Z961 Presence of intraocular lens: Secondary | ICD-10-CM | POA: Diagnosis not present

## 2021-12-31 ENCOUNTER — Encounter: Payer: Self-pay | Admitting: Family Medicine

## 2021-12-31 ENCOUNTER — Ambulatory Visit (INDEPENDENT_AMBULATORY_CARE_PROVIDER_SITE_OTHER): Payer: Medicare Other | Admitting: Family Medicine

## 2021-12-31 VITALS — BP 162/80 | HR 96 | Temp 97.4°F | Ht 61.75 in | Wt 182.8 lb

## 2021-12-31 DIAGNOSIS — I700 Atherosclerosis of aorta: Secondary | ICD-10-CM | POA: Diagnosis not present

## 2021-12-31 DIAGNOSIS — G720 Drug-induced myopathy: Secondary | ICD-10-CM

## 2021-12-31 DIAGNOSIS — Z23 Encounter for immunization: Secondary | ICD-10-CM

## 2021-12-31 DIAGNOSIS — I10 Essential (primary) hypertension: Secondary | ICD-10-CM

## 2021-12-31 DIAGNOSIS — E7800 Pure hypercholesterolemia, unspecified: Secondary | ICD-10-CM

## 2021-12-31 MED ORDER — AMLODIPINE BESYLATE 10 MG PO TABS: 10.0000 mg | ORAL_TABLET | Freq: Every day | ORAL | 3 refills | Status: DC

## 2021-12-31 NOTE — Progress Notes (Signed)
Subjective: CC: Hyperlipidemia PCP: Maria Norlander, DO IOE:[id number] Maria Briggs is a 71 y.o. female presenting to clinic today for:  1.  Hyperlipidemia Patient currently getting Nexlizet through a grant.  She will be due for her renewal in September.  She is not sure how to go about doing this and is asking for assistance with this.  She is tolerating the next visit without difficulty.  No chest pain, shortness of breath, discoloration.  She had some abdominal issues but that has since resolved independently.   ROS: Per HPI  Allergies  Allergen Reactions   Penicillins Hives   Rosuvastatin Calcium Other (See Comments)    Muscle pain, weakness in limbs   Statins Other (See Comments)    Muscle pain, weakness in limbs   Past Medical History:  Diagnosis Date   Allergy    Arthritis    "maybe a little; comes & goes; various parts of my body" (02/20/2015)   Hyperlipidemia    Hypertension    PONV (postoperative nausea and vomiting)     Current Outpatient Medications:    amLODipine (NORVASC) 5 MG tablet, TAKE 1 TABLET (5 MG TOTAL) BY MOUTH DAILY., Disp: 90 tablet, Rfl: 0   aspirin 81 MG tablet, Take 81 mg by mouth daily., Disp: , Rfl:    Biotin 5 MG CAPS, Take 1 capsule by mouth daily. , Disp: , Rfl:    estradiol (ESTRACE) 0.1 MG/GM vaginal cream, Place 1 Applicatorful vaginally every other day., Disp: , Rfl:    meloxicam (MOBIC) 15 MG tablet, TAKE 1/2 TO 1 TABLET (7.5-15 MG TOTAL) BY MOUTH DAILY, Disp: 30 tablet, Rfl: 0   minoxidil (ROGAINE) 2 % external solution, Apply topically 2 (two) times daily., Disp: , Rfl:    NEXLIZET 180-10 MG TABS, Take 1 tablet by mouth daily., Disp: , Rfl:    Omega-3 Fatty Acids (FISH OIL PO), Take 1,000 mg by mouth 2 (two) times daily., Disp: , Rfl:    polyethylene glycol powder (GLYCOLAX/MIRALAX) 17 GM/SCOOP powder, Take 17 g by mouth as needed., Disp: , Rfl:  Social History   Socioeconomic History   Marital status: Married    Spouse name: Coralyn Mark    Number of children: 2   Years of education: Not on file   Highest education level: High school graduate  Occupational History   Occupation: retired  Tobacco Use   Smoking status: Never   Smokeless tobacco: Never  Vaping Use   Vaping Use: Never used  Substance and Sexual Activity   Alcohol use: Not Currently    Comment: 02/20/2015 "might have a drink q 6 months"   Drug use: No   Sexual activity: Not Currently  Other Topics Concern   Not on file  Social History Narrative   Retired, lives with husband Coralyn Mark, 2 children, enjoys reading and puzzles   Social Determinants of Health   Financial Resource Strain: Low Risk  (08/17/2021)   Overall Financial Resource Strain (CARDIA)    Difficulty of Paying Living Expenses: Not hard at all  Food Insecurity: No Food Insecurity (08/17/2021)   Hunger Vital Sign    Worried About Running Out of Food in the Last Year: Never true    [place] in the Last Year: Never true  Transportation Needs: No Transportation Needs (08/17/2021)   PRAPARE - Hydrologist (Medical): No    Lack of Transportation (Non-Medical): No  Physical Activity: Insufficiently Active (08/17/2021)   Exercise Vital Sign  Days of Exercise per Week: 7 days    Minutes of Exercise per Session: 20 min  Stress: No Stress Concern Present (08/17/2021)   [place]    Feeling of Stress : Only a little  Social Connections: Socially Integrated (08/17/2021)   Social Connection and Isolation Panel [NHANES]    Frequency of Communication with Friends and Family: More than three times a week    Frequency of Social Gatherings with Friends and Family: Once a week    Attends Religious Services: 1 to 4 times per year    Active Member of [employer] or Organizations: Yes    Attends Archivist Meetings: 1 to 4 times per year    Marital Status: Married  Human resources officer Violence: Not At Risk  (08/17/2021)   Humiliation, Afraid, Rape, and Kick questionnaire    Fear of Current or Ex-Partner: No    Emotionally Abused: No    Physically Abused: No    Sexually Abused: No   Family History  Problem Relation Age of Onset   Colon polyps Father    Colon cancer Father        ?   Prostate cancer Father    Dementia Father    COPD Father    Cancer Father    Anxiety disorder Mother    COPD Mother    Depression Mother    Heart attack Brother 18   Mood Disorder Son    Stroke Maternal Grandfather    Stroke Paternal Grandmother    Diabetes Paternal Grandmother    Stroke Paternal Grandfather    Diabetes Paternal Grandfather    Esophageal cancer Neg Hx    Stomach cancer Neg Hx    Rectal cancer Neg Hx     Objective: Office vital signs reviewed. BP (!) 151/80   Pulse 96   Temp (!) 97.4 F (36.3 C)   Ht 5' 1.75" (1.568 m)   Wt 182 lb 12.8 oz (82.9 kg)   SpO2 94%   BMI 33.71 kg/m   Physical Examination:  General: Awake, alert, well nourished, No acute distress HEENT: Sclera white.  Moist mucous membranes Cardio: regular rate and rhythm, S1S2 heard, no murmurs appreciated Pulm: clear to auscultation bilaterally, no wheezes, rhonchi or rales; normal work of breathing on room air GI: Soft, nontender, nondistended.  No hepatosplenomegaly appreciated  Assessment/ Plan: 71 y.o. female   Pure hypercholesterolemia - Plan: Lipid Panel, Hepatic function panel  Drug-induced myopathy  Aortic atherosclerosis (HCC)  Essential hypertension - Plan: amLODipine (NORVASC) 10 MG tablet  Need for pneumococcal 20-valent conjugate vaccination - Plan: Pneumococcal conjugate vaccine 20-valent (Prevnar 20)  1 month supply of nexlizet samples given.  Patient given the telephone number to contact in the assistance program to update any information they need for ongoing assistance.  Fasting lipid and liver function tests ordered  Blood pressure was not at goal and I reviewed her chart which  denoted that she had blood pressures that were uncontrolled in her June visit with GI as well.  Norvasc advance to 10 mg daily  Pneumococcal vaccination administered  Orders Placed This Encounter  Procedures   Pneumococcal conjugate vaccine 20-valent (Prevnar 20)   No orders of the defined types were placed in this encounter.    Maria Norlander, DO [place] [phone]

## 2022-01-01 LAB — HEPATIC FUNCTION PANEL
ALT: 22 IU/L (ref 0–32)
AST: 27 IU/L (ref 0–40)
Albumin: 4.6 g/dL (ref 3.9–4.9)
Alkaline Phosphatase: 68 IU/L (ref 44–121)
Bilirubin Total: 1 mg/dL (ref 0.0–1.2)
Bilirubin, Direct: 0.28 mg/dL (ref 0.00–0.40)
Total Protein: 7.1 g/dL (ref 6.0–8.5)

## 2022-01-01 LAB — LIPID PANEL
Chol/HDL Ratio: 2.5 ratio (ref 0.0–4.4)
Cholesterol, Total: 182 mg/dL (ref 100–199)
HDL: 73 mg/dL (ref >39)
LDL Chol Calc (NIH): 93 mg/dL (ref 0–99)
Triglycerides: 89 mg/dL (ref 0–149)
VLDL Cholesterol Cal: 16 mg/dL (ref 5–40)

## 2022-01-03 ENCOUNTER — Telehealth: Payer: Self-pay | Admitting: Family Medicine

## 2022-01-03 ENCOUNTER — Other Ambulatory Visit: Payer: Self-pay | Admitting: Family Medicine

## 2022-01-03 DIAGNOSIS — E7800 Pure hypercholesterolemia, unspecified: Secondary | ICD-10-CM

## 2022-01-03 DIAGNOSIS — G8929 Other chronic pain: Secondary | ICD-10-CM

## 2022-01-04 ENCOUNTER — Encounter: Payer: Self-pay | Admitting: Family Medicine

## 2022-01-04 NOTE — Telephone Encounter (Signed)
Not sure about the refills?  Do I need to print a new rx for her?

## 2022-01-05 MED ORDER — NEXLIZET 180-10 MG PO TABS: 1.0000 | ORAL_TABLET | Freq: Every day | ORAL | 0 refills | Status: DC

## 2022-01-05 NOTE — Telephone Encounter (Signed)
Referral placed.

## 2022-02-06 IMAGING — CT CT ABD-PELV W/O CM
2 of 4 series · 16 of 46 positions shown, 18 images · non-contrast
Comparison: None.

CLINICAL DATA: Left lower quadrant pain.  Diverticulitis suspected

EXAM:
CT ABDOMEN AND PELVIS WITHOUT CONTRAST
TECHNIQUE: Multidetector CT imaging of the abdomen and pelvis was performed
following the standard protocol without IV contrast.

[Series 2: axial st · axial · 0.72mm/px · z∈[+971,+1381]mm · 13 of 94 slices shown, 15 images]
[im 6/94  soft-tissue]
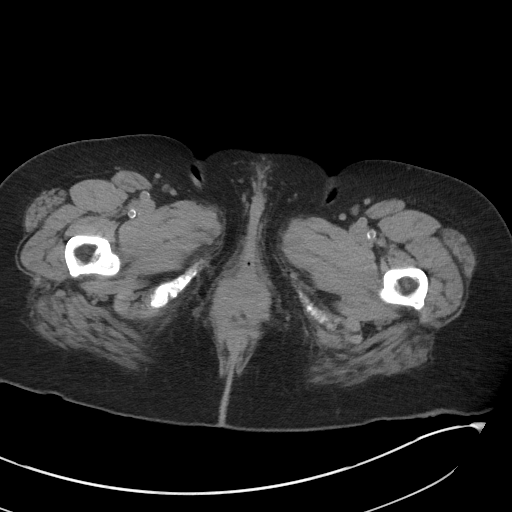
[im 6/94  bone]
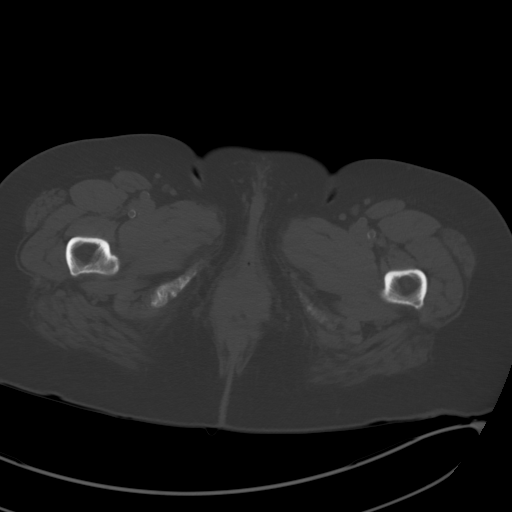
[im 12/94  soft-tissue]
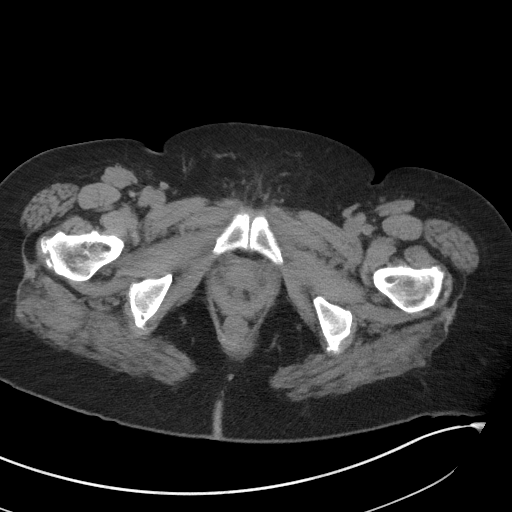
[im 18/94  soft-tissue]
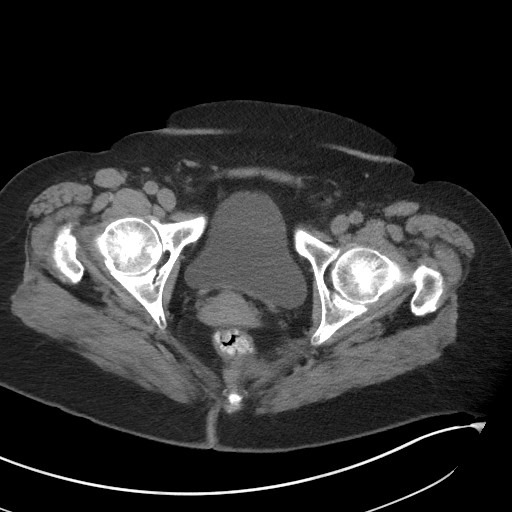
[im 30/94  soft-tissue]
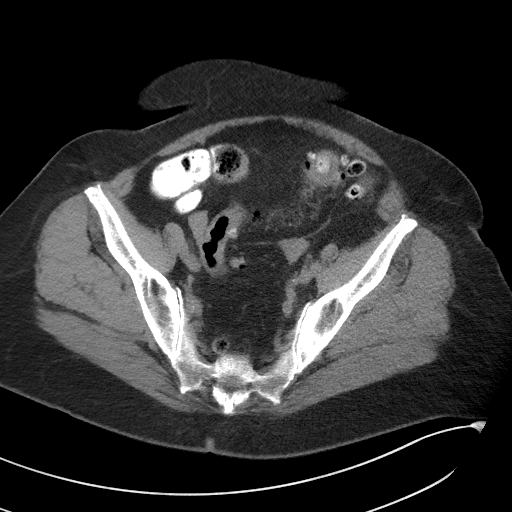
[im 35/94  soft-tissue]
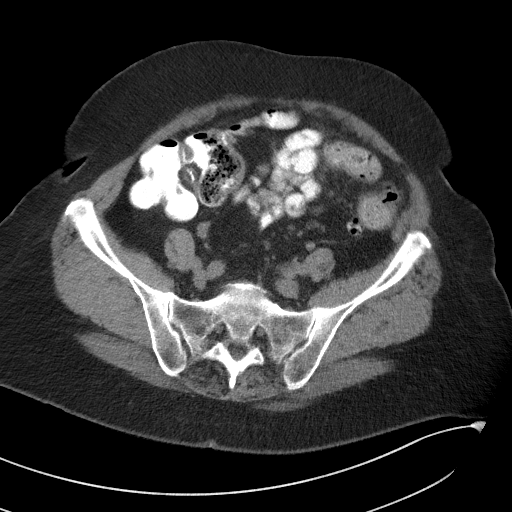
[im 41/94  soft-tissue]
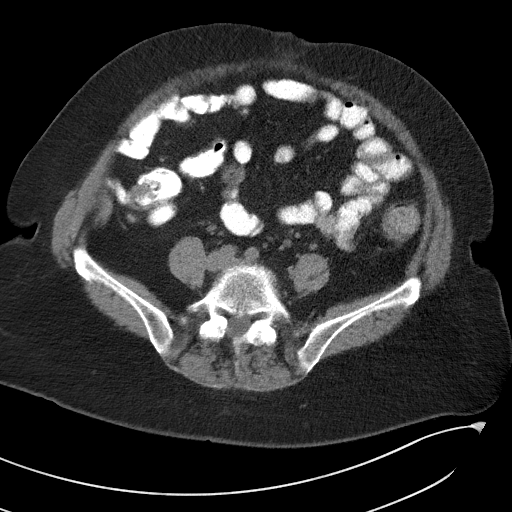
[im 47/94  soft-tissue]
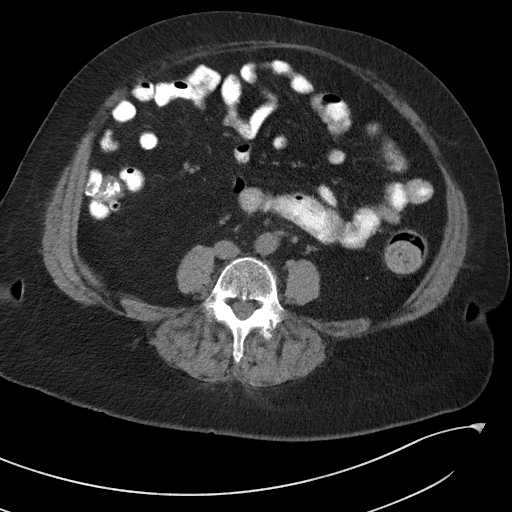
[im 53/94  soft-tissue]
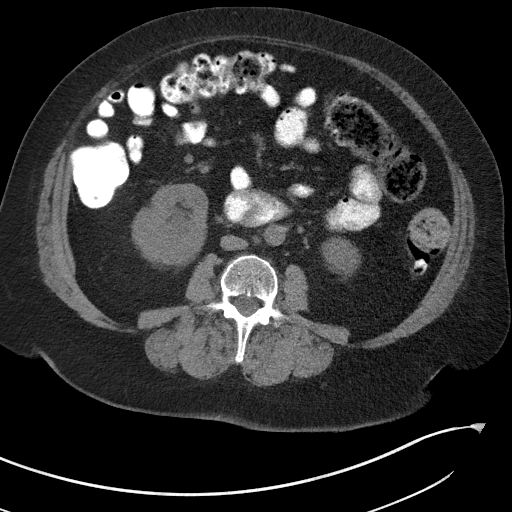
[im 59/94  soft-tissue]
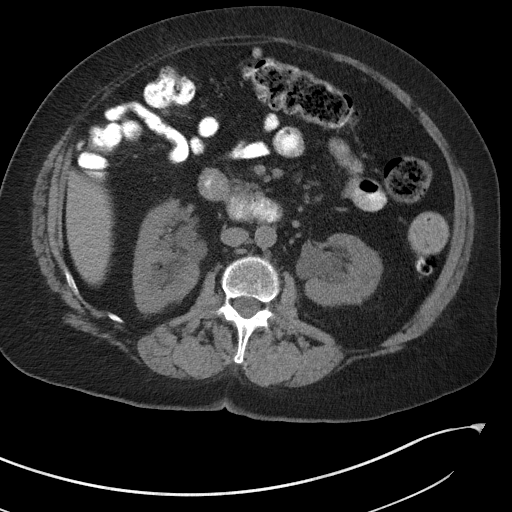
[im 59/94  bone]
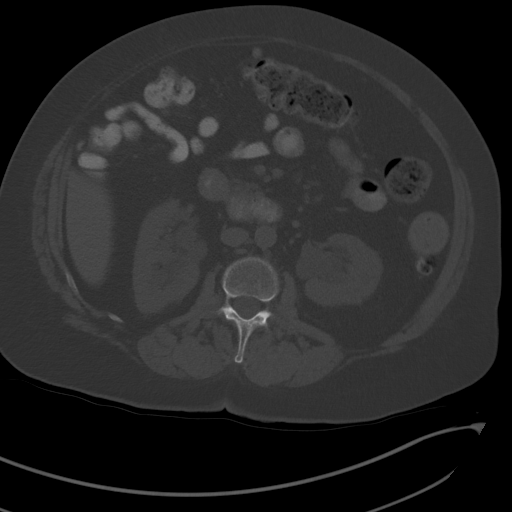
[im 64/94  soft-tissue]
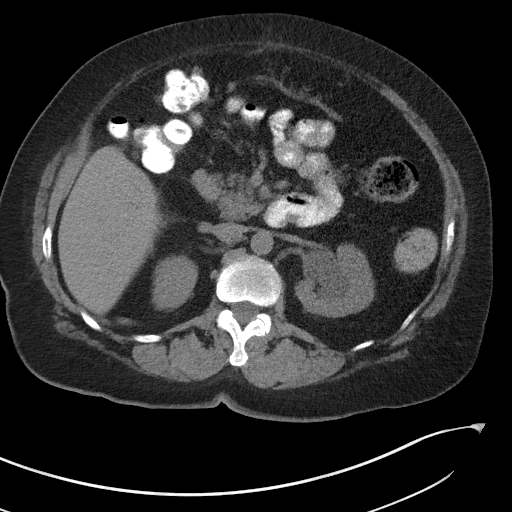
[im 76/94  soft-tissue]
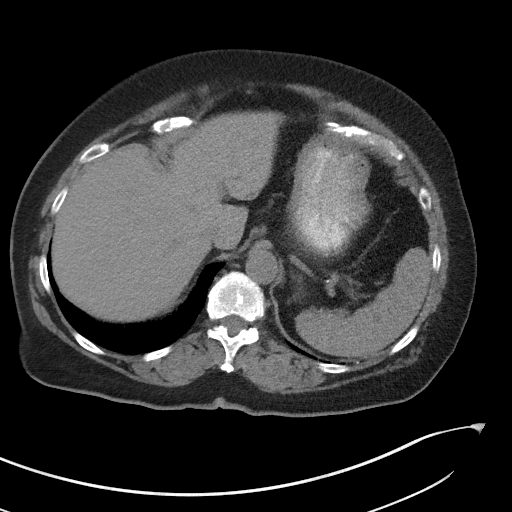
[im 82/94  soft-tissue]
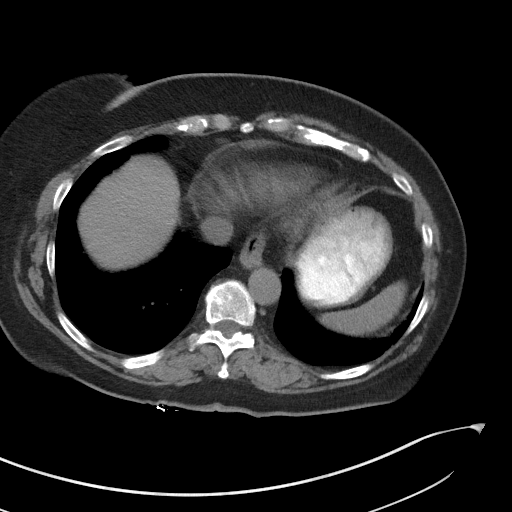
[im 88/94  soft-tissue]
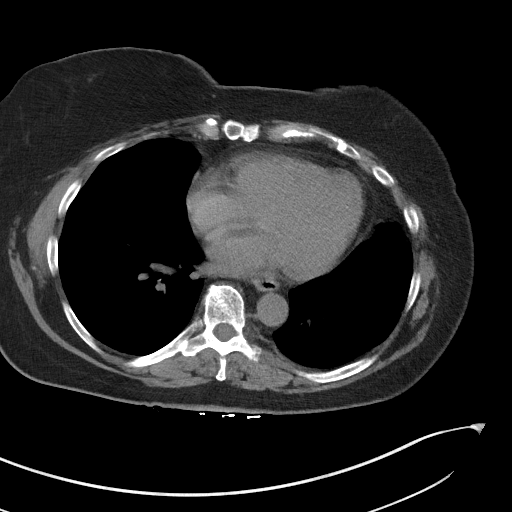

[Series 5: coronal st · coronal · 0.74mm/px · 3 of 122 slices shown]
[im 41/122  soft-tissue]
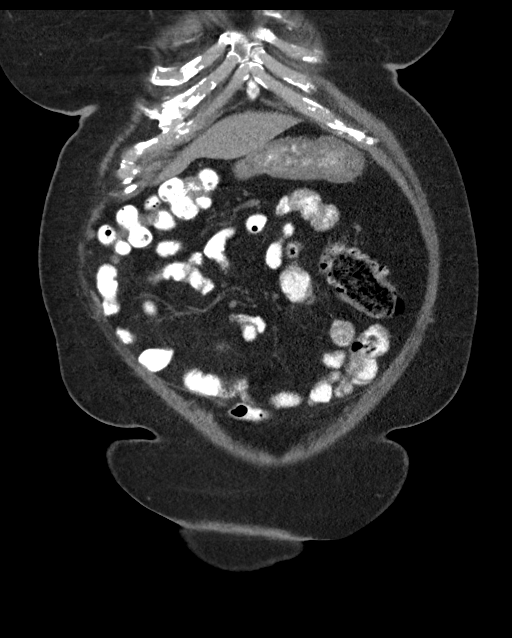
[im 54/122  soft-tissue]
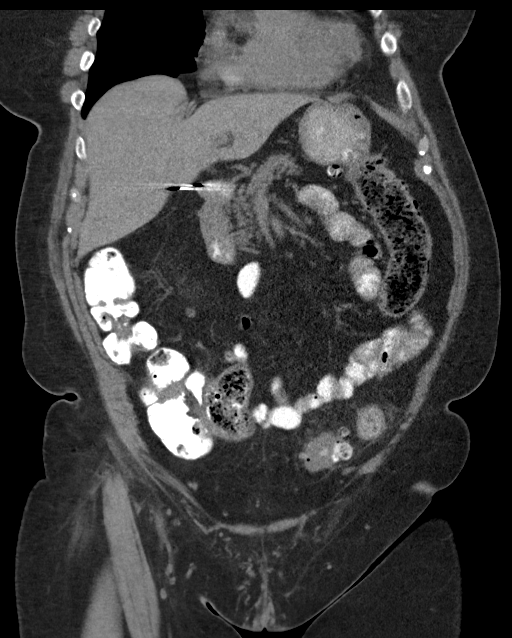
[im 68/122  soft-tissue]
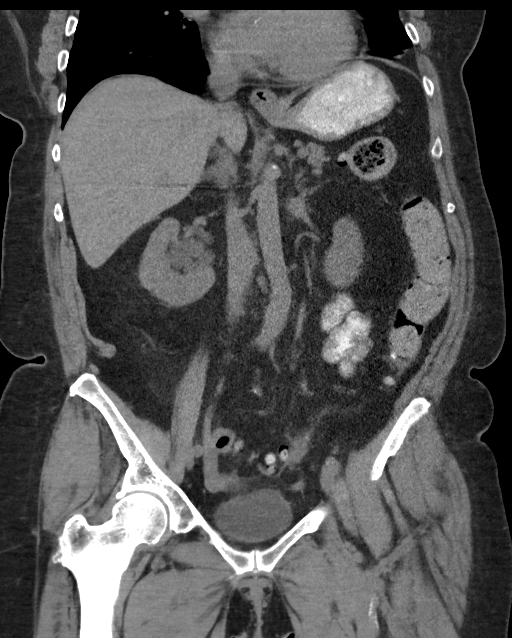

[16 of 46 positions shown; findings below may reference images not displayed]

FINDINGS: Lower chest: No acute abnormality.

Hepatobiliary: No focal liver abnormality is seen. Status post
cholecystectomy. No biliary dilatation.

Pancreas: No focal abnormality or ductal dilatation.

Spleen: No focal abnormality.  Normal size.

Adrenals/Urinary Tract: Probable right renal parapelvic cysts. There
appears to be mild left hydronephrosis. Ureters decompressed with no
stones. Findings likely reflect chronic UPJ obstruction. Punctate
1-2 mm left upper pole nonobstructing renal stone. Adrenal glands
and urinary bladder unremarkable.

Stomach/Bowel: Colonic diverticulosis diffusely. Slight inflation
around the sigmoid colon may reflect early active diverticulitis.
Stomach and small bowel decompressed, unremarkable.

Vascular/Lymphatic: Aortic atherosclerosis.

Reproductive: Uterus and adnexa unremarkable.  No mass.

Other: No free fluid or free air.

Musculoskeletal: No acute bony abnormality.
IMPRESSION: Diffuse colonic diverticulosis. Slight stranding around the sigmoid
colon may reflect early active diverticulitis.

Mild left hydronephrosis with a chronic UPJ configuration. Punctate
nonobstructing stone in the upper pole of the left kidney.

## 2022-03-07 ENCOUNTER — Encounter: Payer: Self-pay | Admitting: Nurse Practitioner

## 2022-03-07 ENCOUNTER — Ambulatory Visit: Payer: Medicare Other | Admitting: Nurse Practitioner

## 2022-03-07 VITALS — BP 140/80 | HR 98 | Ht 61.75 in | Wt 186.2 lb

## 2022-03-07 DIAGNOSIS — R109 Unspecified abdominal pain: Secondary | ICD-10-CM | POA: Diagnosis not present

## 2022-03-07 DIAGNOSIS — K5900 Constipation, unspecified: Secondary | ICD-10-CM | POA: Diagnosis not present

## 2022-03-07 NOTE — Patient Instructions (Addendum)
Increase your water intake, drink 6 to 8 glasses of water daily. Once your are drinking at least 6 glasses (48 ounces) of water daily then start Benefiber one tablespoon daily to bulk up your stool  Ibgard (peppermint oil) take one capsule by mouth twice daily for abdominal gas bloat pain  Take Miralax 1/2 to 1  capful mixed in 8 ounces of water at bedtime as needed   Contact our office if your abdominal bloat pain worsens.  Go to the emergency room if you develop severe abdominal pain   It was a pleasure to see you today!  Thank you for trusting me with your gastrointestinal care!

## 2022-03-07 NOTE — Progress Notes (Unsigned)
     03/07/2022 Maria Briggs [id number] May 02, 1951   Chief Complaint:  History of Present Illness: Maria Briggs. Maria Briggs is a 71 year old female with a past medical history of   She complains of having left mid abd pain  She took Miralax for 6 to 8 weeks.  Then as needed.  IF she takes Miralax x 4 days no pain [name], sitting goes away Standing or moving.  Stopped Miralax on Wed. Yesterday 6 to 7 times Sunday.  Sometimes everytime she eats, 3 or 4 times.  1/2 dose daily 6 to 7 times daily  No blood, no diarrhea  No recent antibiotics  Pizza, hamburger,  veg 3 to 4 days ewekly  She drinks diet coke 2 1/2 to 3 16 oz diet cokes. 12 ounces of water.      Diverticulitis. She is followed by Dr. [name].   CT scan was ordered and performed September 06, 2021.  She was found to have tiny left renal calculus.  Also colonic diverticulosis without diverticulitis and a tiny periumbilical hernia.  Blood work from August 30, 2021 (saw her PCP for back pain that day) was unremarkable including normal white blood cell count of 4.6.  He   Colonoscopy 08/04/2021: Diverticulosis in the entire examined colon. - Rectosigmoid stenosis - The examination was otherwise normal on direct and retroflexion views. - No specimens collected -Recall colonoscopy in 5 years with ultrathin colonoscope    Current Medications, Allergies, Past Medical History, Past Surgical History, Family History and Social History were reviewed in [employer] record.   Review of Systems:   Constitutional: Negative for fever, sweats, chills or weight loss.  Respiratory: Negative for shortness of breath.   Cardiovascular: Negative for chest pain, palpitations and leg swelling.  Gastrointestinal: See HPI.  Musculoskeletal: Negative for back pain or muscle aches.  Neurological: Negative for dizziness, headaches or paresthesias.    Physical Exam: BP (!) 140/80 (BP Location: Left Arm, Patient  Position: Sitting, Cuff Size: Large)   Pulse 98   Ht 5' 1.75" (1.568 m)   Wt 186 lb 4 oz (84.5 kg)   SpO2 100%   BMI 34.34 kg/m  General: Well developed, w   ***female in no acute distress. Head: Normocephalic and atraumatic. Eyes: No scleral icterus. Conjunctiva pink . Ears: Normal auditory acuity. Mouth: Dentition intact. No ulcers or lesions.  Lungs: Clear throughout to auscultation. Heart: Regular rate and rhythm, no murmur. Abdomen: Soft, nontender and nondistended. No masses or hepatomegaly. Normal bowel sounds x 4 quadrants.  Rectal: Deferred.  Musculoskeletal: Symmetrical with no gross deformities. Extremities: No edema. Neurological: Alert oriented x 4. No focal deficits.  Psychological: Alert and cooperative. Normal mood and affect  Assessment and Recommendations: ***

## 2022-03-08 NOTE — Progress Notes (Signed)
Assessment and plans noted ?

## 2022-04-05 ENCOUNTER — Ambulatory Visit (INDEPENDENT_AMBULATORY_CARE_PROVIDER_SITE_OTHER): Payer: Medicare Other | Admitting: Family Medicine

## 2022-04-05 ENCOUNTER — Encounter: Payer: Self-pay | Admitting: Family Medicine

## 2022-04-05 VITALS — BP 151/83 | HR 94 | Temp 97.9°F | Ht 61.75 in | Wt 185.2 lb

## 2022-04-05 DIAGNOSIS — I10 Essential (primary) hypertension: Secondary | ICD-10-CM | POA: Diagnosis not present

## 2022-04-05 DIAGNOSIS — Z23 Encounter for immunization: Secondary | ICD-10-CM

## 2022-04-05 DIAGNOSIS — L723 Sebaceous cyst: Secondary | ICD-10-CM

## 2022-04-05 DIAGNOSIS — E7800 Pure hypercholesterolemia, unspecified: Secondary | ICD-10-CM | POA: Diagnosis not present

## 2022-04-05 DIAGNOSIS — I700 Atherosclerosis of aorta: Secondary | ICD-10-CM

## 2022-04-05 DIAGNOSIS — R1032 Left lower quadrant pain: Secondary | ICD-10-CM | POA: Diagnosis not present

## 2022-04-05 DIAGNOSIS — G720 Drug-induced myopathy: Secondary | ICD-10-CM | POA: Diagnosis not present

## 2022-04-05 MED ORDER — NEXLIZET 180-10 MG PO TABS: 1.0000 | ORAL_TABLET | Freq: Every day | ORAL | 3 refills | Status: DC

## 2022-04-05 NOTE — Progress Notes (Signed)
Subjective: CC: Hypertension, hyperlipidemia PCP: [name], DO RFF:[id number] Maria Briggs is a 71 y.o. female presenting to clinic today for:  1.  Hypertension associate with hyperlipidemia Patient is compliant with medications.  She needs a written prescription for the Nexlizet so that she can reapply for patient assistance program.  She notes blood pressures typically run 140/70s.  No chest pain, shortness of breath, visual disturbance or edema reported  2.  Left lower quadrant pain Patient reports this is improving with the supplements that were recommended by GI.  No reported nausea, vomiting or blood in stool.  3.  Cyst She reports a lump on the back of her neck that has been present for several weeks.  It got much larger at 1 point and then went back down.  Denies any drainage.  She notes that it becomes irritated when she brushes her hair.   ROS: Per HPI  Allergies  Allergen Reactions   Penicillins Hives   Rosuvastatin Calcium Other (See Comments)    Muscle pain, weakness in limbs   Statins Other (See Comments)    Muscle pain, weakness in limbs   Past Medical History:  Diagnosis Date   Allergy    Arthritis    "maybe a little; comes & goes; various parts of my body" (02/20/2015)   Hyperlipidemia    Hypertension    PONV (postoperative nausea and vomiting)     Current Outpatient Medications:    amLODipine (NORVASC) 10 MG tablet, Take 1 tablet (10 mg total) by mouth daily., Disp: 90 tablet, Rfl: 3   aspirin 81 MG tablet, Take 81 mg by mouth daily., Disp: , Rfl:    Biotin 5 MG CAPS, Take 1 capsule by mouth daily. , Disp: , Rfl:    estradiol (ESTRACE) 0.1 MG/GM vaginal cream, Place 1 Applicatorful vaginally every other day., Disp: , Rfl:    meloxicam (MOBIC) 15 MG tablet, TAKE 1/2 TO 1 TABLET (7.5-15 MG TOTAL) BY MOUTH DAILY, Disp: 30 tablet, Rfl: 2   minoxidil (ROGAINE) 2 % external solution, Apply topically 2 (two) times daily., Disp: , Rfl:    NEXLIZET 180-10 MG  TABS, Take 1 tablet by mouth daily., Disp: 90 tablet, Rfl: 0   Omega-3 Fatty Acids (FISH OIL PO), Take 1,000 mg by mouth 2 (two) times daily., Disp: , Rfl:    polyethylene glycol powder (GLYCOLAX/MIRALAX) 17 GM/SCOOP powder, Take 17 g by mouth as needed., Disp: , Rfl:  Social History   Socioeconomic History   Marital status: Married    Spouse name: Maria Briggs   Number of children: 2   Years of education: Not on file   Highest education level: High school graduate  Occupational History   Occupation: retired  Tobacco Use   Smoking status: Never   Smokeless tobacco: Never  Vaping Use   Vaping Use: Never used  Substance and Sexual Activity   Alcohol use: Not Currently    Comment: 02/20/2015 "might have a drink q 6 months"   Drug use: No   Sexual activity: Not Currently  Other Topics Concern   Not on file  Social History Narrative   Retired, lives with husband Maria Briggs, 2 children, enjoys reading and puzzles   Social Determinants of Health   Financial Resource Strain: Low Risk  (08/17/2021)   Overall Financial Resource Strain (CARDIA)    Difficulty of Paying Living Expenses: Not hard at all  Food Insecurity: No Food Insecurity (08/17/2021)   Hunger Vital Sign    Worried About  Running Out of Food in the Last Year: Never true    [place] in the Last Year: Never true  Transportation Needs: No Transportation Needs (08/17/2021)   PRAPARE - Hydrologist (Medical): No    Lack of Transportation (Non-Medical): No  Physical Activity: Insufficiently Active (08/17/2021)   Exercise Vital Sign    Days of Exercise per Week: 7 days    Minutes of Exercise per Session: 20 min  Stress: No Stress Concern Present (08/17/2021)   [place]    Feeling of Stress : Only a little  Social Connections: Socially Integrated (08/17/2021)   Social Connection and Isolation Panel [NHANES]    Frequency of Communication  with Friends and Family: More than three times a week    Frequency of Social Gatherings with Friends and Family: Once a week    Attends Religious Services: 1 to 4 times per year    Active Member of [employer] or Organizations: Yes    Attends Archivist Meetings: 1 to 4 times per year    Marital Status: Married  Human resources officer Violence: Not At Risk (08/17/2021)   Humiliation, Afraid, Rape, and Kick questionnaire    Fear of Current or Ex-Partner: No    Emotionally Abused: No    Physically Abused: No    Sexually Abused: No   Family History  Problem Relation Age of Onset   Colon polyps Father    Colon cancer Father        ?   Prostate cancer Father    Dementia Father    COPD Father    Cancer Father    Anxiety disorder Mother    COPD Mother    Depression Mother    Heart attack Brother 68   Mood Disorder Son    Stroke Maternal Grandfather    Stroke Paternal Grandmother    Diabetes Paternal Grandmother    Stroke Paternal Grandfather    Diabetes Paternal Grandfather    Esophageal cancer Neg Hx    Stomach cancer Neg Hx    Rectal cancer Neg Hx     Objective: Office vital signs reviewed. BP (!) 151/83   Pulse 94   Temp 97.9 F (36.6 C)   Ht 5' 1.75" (1.568 m)   Wt 185 lb 3.2 oz (84 kg)   SpO2 97%   BMI 34.15 kg/m   Physical Examination:  General: Awake, alert, well nourished, No acute distress HEENT: sclera white, MMM Neck: Pea-sized cyst noted along the right lower posterior nape of neck.  No exudates.  It is well-circumscribed.  Minimally erythematous. Cardio: regular rate and rhythm, S1S2 heard, no murmurs appreciated Pulm: clear to auscultation bilaterally, no wheezes, rhonchi or rales; normal work of breathing on room air GI: soft, non-tender, non-distended, bowel sounds present x4, no hepatomegaly, no splenomegaly, no masses   Assessment/ Plan: 71 y.o. female   Inflamed sebaceous cyst - Plan: Ambulatory referral to General Surgery  Need for immunization  against influenza - Plan: Flu Vaccine QUAD High Dose(Fluad)  Aortic atherosclerosis (HCC) - Plan: NEXLIZET 180-10 MG TABS  Drug-induced myopathy - Plan: NEXLIZET 180-10 MG TABS  Pure hypercholesterolemia - Plan: NEXLIZET 180-10 MG TABS  Essential hypertension  LLQ pain  Referral to general surgery for excision of the sebaceous cyst.  No evidence of infection at this time but we discussed that if it does become infected contact me and we will place her on antibiotics  Influenza vaccination administered.  Written prescription for the cholesterol medication has been given to the patient so that she can reapply for patient assistance on this.  Not yet due for fasting lipid  Blood pressure is NOT controlled upon recheck but home blood pressures are controlled so for now we will make no changes  Lower quadrant pain is improving with GI recommendations  Orders Placed This Encounter  Procedures   Flu Vaccine QUAD High Dose(Fluad)   No orders of the defined types were placed in this encounter.    [name], DO [place] [phone]

## 2022-04-05 NOTE — Patient Instructions (Signed)
Epidermoid Cyst  An epidermoid cyst, also called an epidermal cyst, is a small lump under your skin. The cyst contains a substance called keratin. Do not try to pop or open the cyst yourself. What are the causes? A blocked hair follicle. A hair that curls and re-enters the skin instead of growing straight out of the skin. A blocked pore. Irritated skin. An injury to the skin. Certain conditions that are passed along from parent to child. Human papillomavirus (HPV). This happens rarely when cysts occur on the bottom of the feet. Long-term sun damage to the skin. What increases the risk? Having acne. Being female. Having an injury to the skin. Being past puberty. Having certain conditions caused by genes (genetic disorder) What are the signs or symptoms? These cysts are usually harmless, but they can get infected. Symptoms of infection may include: Redness. Inflammation. Tenderness. Warmth. Fever. A bad-smelling substance that drains from the cyst. Pus that drains from the cyst. How is this treated? In many cases, epidermoid cysts go away on their own without treatment. If a cyst becomes infected, treatment may include: Opening and draining the cyst, done by a doctor. After draining, you may need minor surgery to remove the rest of the cyst. Antibiotic medicine. Shots of medicines (steroids) that help to reduce inflammation. Surgery to remove the cyst. Surgery may be done if the cyst: Becomes large. Bothers you. Has a chance of turning into cancer. Do not try to open a cyst yourself. Follow these instructions at home: Medicines Take over-the-counter and prescription medicines as told by your doctor. If you were prescribed an antibiotic medicine, take it as told by your doctor. Do not stop taking it even if you start to feel better. General instructions Keep the area around your cyst clean and dry. Wear loose, dry clothing. Avoid touching your cyst. Check your cyst every day  for signs of infection. Check for: Redness, swelling, or pain. Fluid or blood. Warmth. Pus or a bad smell. Keep all follow-up visits. How is this prevented? Wear clean, dry, clothing. Avoid wearing tight clothing. Keep your skin clean and dry. Take showers or baths every day. Contact a doctor if: Your cyst has symptoms of infection. Your condition does not improve or gets worse. You have a cyst that looks different from other cysts you have had. You have a fever. Get help right away if: Redness spreads from the cyst into the area close by. Summary An epidermoid cyst is a small lump under your skin. If a cyst becomes infected, treatment may include surgery to open and drain the cyst, or to remove it. Take over-the-counter and prescription medicines only as told by your doctor. Contact a doctor if your condition is not improving or is getting worse. Keep all follow-up visits. This information is not intended to replace advice given to you by your health care provider. Make sure you discuss any questions you have with your health care provider. Document Revised: 08/14/2019 Document Reviewed: 08/14/2019 Elsevier Patient Education  2023 Elsevier Inc.  

## 2022-04-19 ENCOUNTER — Encounter: Payer: Self-pay | Admitting: General Surgery

## 2022-04-19 ENCOUNTER — Ambulatory Visit: Payer: Medicare Other | Admitting: General Surgery

## 2022-04-19 VITALS — BP 150/79 | HR 97 | Temp 98.2°F | Resp 14 | Ht 61.75 in | Wt 186.0 lb

## 2022-04-19 DIAGNOSIS — D235 Other benign neoplasm of skin of trunk: Secondary | ICD-10-CM | POA: Diagnosis not present

## 2022-04-19 NOTE — Patient Instructions (Addendum)
Call for antibiotics if you think it is getting swollen and infected again.   Epidermoid Cyst Removal Epidermoid cyst removal is a procedure to remove a fluid-filled sac that forms under your skin (epidermoid cyst). This type of cyst is filled with a thick, oily substance (keratin) that is secreted by your skin glands. Epidermoid cysts may also be called epidermal cysts, or keratin cysts. Normally, the skin secretes this pasty material through a gland or a hair follicle. However, when a skin gland or hair follicle becomes blocked, an epidermoid cyst can form. You may need this procedure if you have an epidermal cyst that becomes large, uncomfortable, or inflamed. Tell a health care provider about: Any allergies you have. All medicines you are taking, including vitamins, herbs, eye drops, creams, and over-the-counter medicines. Any problems you or family members have had with anesthetic medicines. Any blood disorders you have. Any surgeries you have had. Any medical conditions you have now or have had. Whether you are pregnant or may be pregnant. What are the risks? Generally, this is a safe procedure. However, problems may occur, including: Recurrence of the cyst. Bleeding. Infection. Scarring. What happens before the procedure? Ask your health care provider about: Changing or stopping your regular medicines. This is especially important if you are taking diabetes medicines or blood thinners. Taking medicines such as aspirin and ibuprofen. These medicines can thin your blood. Do not take these medicines unless your health care provider tells you to take them. Taking over-the-counter medicines, vitamins, herbs, and supplements. If you have an inflamed or infected cyst, you may have to take antibiotic medicine before the cyst removal. Take your antibiotic as told by your health care provider. Do not stop taking the antibiotic even if you start to feel better. Take a shower on the morning of  your procedure. Your health care provider may ask you to use a germ-killing soap. What happens during the procedure?  You will be given a medicine to numb the area (local anesthetic). The skin around the cyst will be cleaned with a germ-killing solution. The health care provider will make a small incision in your skin over the cyst. The health care provider will separate the cyst from the surrounding tissues that are under your skin. If possible, the cyst will be removed undamaged (intact). If the cyst bursts (ruptures), it will be removed in pieces. After the cyst is removed, the health care provider will control any bleeding and close the incision with small stitches (sutures). Small incisions may not need sutures, and the bleeding will be controlled by applying direct pressure with gauze. The health care provider may apply antibiotic ointment and a bandage (dressing) over the incision. The procedure may vary among health care providers and hospitals. What happens after the procedure? If you are prescribed an antibiotic medicine or ointment, take or apply it as told by your health care provider. Do not stop using the antibiotic even if you start to feel better. Summary Epidermoid cyst removal is a procedure to remove a sac that has formed under your skin. You may need this procedure if you have an epidermoid cyst that becomes large, uncomfortable, or inflamed. The health care provider will make a small incision in your skin to remove the cyst. If you are prescribed an antibiotic medicine before the procedure, after the procedure, or both, use the antibiotic as told by your health care provider. Do not stop using the antibiotic even if you start to feel better. This information is  not intended to replace advice given to you by your health care provider. Make sure you discuss any questions you have with your health care provider. Document Revised: 08/14/2019 Document Reviewed:  08/14/2019 Elsevier Patient Education  [place].   Epidermoid Cyst  An epidermoid cyst, also known as epidermal cyst, is a sac made of skin tissue. The sac contains a substance called keratin. Keratin is a protein that is normally secreted through the hair follicles. When keratin becomes trapped in the top layer of skin (epidermis), it can form an epidermoid cyst. Epidermoid cysts can be found anywhere on your body. These cysts are usually harmless (benign), and they may not cause symptoms unless they become inflamed or infected. What are the causes? This condition may be caused by: A blocked hair follicle. A hair that curls and re-enters the skin instead of growing straight out of the skin (ingrown hair). A blocked pore. Irritated skin. An injury to the skin. Certain conditions that are passed along from parent to child (inherited). Human papillomavirus (HPV). This happens rarely when cysts occur on the bottom of the feet. Long-term (chronic) sun damage to the skin. What increases the risk? The following factors may make you more likely to develop an epidermoid cyst: Having acne. Being female. Having an injury to the skin. Being past puberty. Having certain rare genetic disorders. What are the signs or symptoms? The only symptom of this condition may be a small, painless lump underneath the skin. When an epidermal cyst ruptures, it may become inflamed. True infection in cysts is rare. Symptoms may include: Redness. Inflammation. Tenderness. Warmth. Keratin draining from the cyst. Keratin is grayish-white, bad-smelling substance. Pus draining from the cyst. How is this diagnosed? This condition is diagnosed with a physical exam. In some cases, you may have a sample of tissue (biopsy) taken from your cyst to be examined under a microscope or tested for bacteria. You may be referred to a health care provider who specializes in skin care (dermatologist). How is this  treated? If a cyst becomes inflamed, treatment may include: Opening and draining the cyst, done by a health care provider. After draining, minor surgery to remove the rest of the cyst may be done. Taking antibiotic medicine. Having injections of medicines (steroids) that help to reduce inflammation. Having surgery to remove the cyst. Surgery may be done if the cyst: Becomes large. Bothers you. Has a chance of turning into cancer. Do not try to open a cyst yourself. Follow these instructions at home: Medicines If you were prescribed an antibiotic medicine, take it it as told by your health care provider. Do not stop using the antibiotic even if you start to feel better. Take over-the-counter and prescription medicines only as told by your health care provider. General instructions Keep the area around your cyst clean and dry. Wear loose, dry clothing. Avoid touching your cyst. Check your cyst every day for signs of infection. Check for: Redness, swelling, or pain. Fluid or blood. Warmth. Pus or a bad smell. Keep all follow-up visits. This is important. How is this prevented? Wear clean, dry, clothing. Avoid wearing tight clothing. Keep your skin clean and dry. Take showers or baths every day. Contact a health care provider if: Your cyst develops symptoms of infection. Your condition is not improving or is getting worse. You develop a cyst that looks different from other cysts you have had. You have a fever. Get help right away if: Redness spreads from the cyst into the surrounding  area. Summary An epidermoid cyst is a sac made of skin tissue. These cysts are usually harmless (benign), and they may not cause symptoms unless they become inflamed. If a cyst becomes inflamed, treatment may include surgery to open and drain the cyst, or to remove it. Treatment may also include medicines by mouth or through an injection. Take over-the-counter and prescription medicines only as told by  your health care provider. If you were prescribed an antibiotic medicine, take it as told by your health care provider. Do not stop using the antibiotic even if you start to feel better. Contact a health care provider if your condition is not improving or is getting worse. Keep all follow-up visits as told by your health care provider. This is important. This information is not intended to replace advice given to you by your health care provider. Make sure you discuss any questions you have with your health care provider. Document Revised: 08/14/2019 Document Reviewed: 08/14/2019 Elsevier Patient Education  [place].

## 2022-04-19 NOTE — Progress Notes (Signed)
[facility] History and Physical  Reason for Referral:*** Referring Physician: ***  Chief Complaint   New Patient (Initial Visit)     Maria Briggs is a 71 y.o. female.  HPI: ***.  The *** started *** and has had a duration of ***.  It is associated with ***.  The *** is improved with ***, and is made worse with ***.    Quality*** Context***  Past Medical History:  Diagnosis Date  . Allergy   . Arthritis    "maybe a little; comes & goes; various parts of my body" (02/20/2015)  . Hyperlipidemia   . Hypertension   . PONV (postoperative nausea and vomiting)     Past Surgical History:  Procedure Laterality Date  . BACK SURGERY    . BLADDER SUSPENSION  ~ 2005  . CHOLECYSTECTOMY  05/24/1971  . COLONOSCOPY    . COLONOSCOPY W/ BIOPSIES AND POLYPECTOMY    . ENDOMETRIAL ABLATION  2006?  . EYE SURGERY    . HERNIA REPAIR    . INCISIONAL HERNIA REPAIR N/A 02/20/2015   Procedure: LAPAROSCOPIC INCISIONAL HERNIA REPAIR WITH MESH;  Surgeon: Coralie Keens, MD;  Location: [facility];  Service: General;  Laterality: N/A;  . INSERTION OF MESH N/A 02/20/2015   Procedure: INSERTION OF MESH;  Surgeon: Coralie Keens, MD;  Location: [facility];  Service: General;  Laterality: N/A;  . LAPAROSCOPIC INCISIONAL / UMBILICAL / [place]  02/20/2015   incisional w/mesh  . LUMBAR DISC SURGERY  05/24/1991  . MOLE REMOVAL  01/22/1999   "off my back; no cancer"  . SPINE SURGERY     Ruptured disc lower back  . TONSILLECTOMY  05/23/1957  . TUBAL LIGATION  05/23/1977    Family History  Problem Relation Age of Onset  . Colon polyps Father   . Colon cancer Father        ?  . Prostate cancer Father   . Dementia Father   . COPD Father   . Cancer Father   . Anxiety disorder Mother   . COPD Mother   . Depression Mother   . Heart attack Brother 33  . Mood Disorder Son   . Stroke Maternal Grandfather   . Stroke Paternal Grandmother   . Diabetes Paternal Grandmother    . Stroke Paternal Grandfather   . Diabetes Paternal Grandfather   . Esophageal cancer Neg Hx   . Stomach cancer Neg Hx   . Rectal cancer Neg Hx     Social History   Tobacco Use  . Smoking status: Never  . Smokeless tobacco: Never  Vaping Use  . Vaping Use: Never used  Substance Use Topics  . Alcohol use: Not Currently    Comment: 02/20/2015 "might have a drink q 6 months"  . Drug use: No    Medications: {medication reviewed/display:3041432} Allergies as of 04/19/2022       Reactions   Penicillins Hives   Rosuvastatin Calcium Other (See Comments)   Muscle pain, weakness in limbs   Statins Other (See Comments)   Muscle pain, weakness in limbs        Medication List        Accurate as of April 19, 2022  9:51 AM. If you have any questions, ask your nurse or doctor.          amLODipine 10 MG tablet Commonly known as: NORVASC Take 1 tablet (10 mg total) by mouth daily.   aspirin 81 MG tablet Take 81 mg  by mouth daily.   BENEFIBER DRINK MIX PO   Biotin 5 MG Caps Take 1 capsule by mouth daily.   estradiol 0.1 MG/GM vaginal cream Commonly known as: ESTRACE Place 1 Applicatorful vaginally every other day.   FISH OIL PO Take 1,000 mg by mouth 2 (two) times daily.   IBgard 90 MG Cpcr Generic drug: Peppermint Oil   meloxicam 15 MG tablet Commonly known as: MOBIC TAKE 1/2 TO 1 TABLET (7.5-15 MG TOTAL) BY MOUTH DAILY   minoxidil 2 % external solution Commonly known as: ROGAINE Apply topically 2 (two) times daily.   Nexlizet 180-10 MG Tabs Generic drug: Bempedoic Acid-Ezetimibe Take 1 tablet by mouth daily.   polyethylene glycol powder 17 GM/SCOOP powder Commonly known as: GLYCOLAX/MIRALAX Take 17 g by mouth as needed.         ROS:  {Review of Systems:30496}  Blood pressure (!) 150/79, pulse 97, temperature 98.2 F (36.8 C), temperature source Oral, resp. rate 14, height 5' 1.75" (1.568 m), weight 186 lb (84.4 kg), SpO2 96 %. Physical  Exam  Results: No results found for this or any previous visit (from the past 48 hour(s)).  No results found.   Assessment & Plan:  Maria Briggs is a 71 y.o. female with *** -*** -*** -Follow up ***  All questions were answered to the satisfaction of the patient and family***.  The risk and benefits of *** were discussed including but not limited to ***.  After careful consideration, Maria Briggs has decided to ***.    Virl Cagey 04/19/2022, 9:51 AM

## 2022-05-19 ENCOUNTER — Ambulatory Visit (INDEPENDENT_AMBULATORY_CARE_PROVIDER_SITE_OTHER): Payer: Medicare Other | Admitting: General Surgery

## 2022-05-19 ENCOUNTER — Encounter: Payer: Self-pay | Admitting: General Surgery

## 2022-05-19 VITALS — BP 145/74 | HR 89 | Temp 98.1°F | Resp 12 | Ht 61.75 in | Wt 187.0 lb

## 2022-05-19 DIAGNOSIS — L720 Epidermal cyst: Secondary | ICD-10-CM | POA: Diagnosis not present

## 2022-05-19 DIAGNOSIS — D235 Other benign neoplasm of skin of trunk: Secondary | ICD-10-CM | POA: Diagnosis not present

## 2022-05-19 NOTE — Patient Instructions (Signed)
Remove bandage in 48 hours. Replace bandage and do neosporin daily after that bandage is removed. Call with concerns. Ok to shower and get wet after 48 hours.

## 2022-05-19 NOTE — Progress Notes (Signed)
[facility] Procedure Note  05/19/22  Pre-procedure Diagnosis: Right neck cyst   Post-procedure Diagnosis: Same   Procedure(s) Performed: Excision of neck cyst 1.5cm    Surgeon: Ria Comment C. Constance Haw, MD   Assistants: No qualified resident was available    Anesthesia: Lidocaine 1%    Specimens: Skin and fibrotic tissue/ involuted cyst    Estimated Blood Loss: Minimal  Wound Class: Clean    Procedure Indications: Ms. Quest is a 71 yo who comes in with a cyst on the right posterior neck that has shrank since her last visit. This area is now smaller than before but we were able to locate it via the central pit. We discussed excision and risk of bleeding, infection, finding something other than a cyst, and recurrence.   Findings: Small involuted/ fibrotic area under skin    Procedure: The patient was taken to the procedure room and placed upright in a chair. The right posterior neck was prepared and draped in the usual sterile fashion. Lidocaine 1% was injected around the central pit. An elliptical incision was made and carried down to the subcutaneous tissue. With sharp excision, I removed the fibrotic area under the skin and central pit. There appeared to be a small daughter cyst adjacent to the area. The cavity was irrigated. No cyst wall was noted. Hemostasis was achieved. The skin was closed with 3-0 Nylon interrupted sutures. The cyst /fibrotic area measured 1.5cm in size.   Neosporin and a bandaid were placed.   Final inspection revealed acceptable hemostasis. The patient tolerated the procedure well.   Remove bandage in 48 hours. Replace bandage and do neosporin daily after that bandage is removed. Call with concerns. Ok to shower and get wet after 48 hours.  Curlene Labrum, MD [facility] [address] [place], [place] [address] [phone] (office)

## 2022-05-26 LAB — TISSUE SPECIMEN

## 2022-05-26 LAB — PATHOLOGY REPORT

## 2022-05-26 NOTE — Progress Notes (Signed)
Let patient know we got the cyst, it was ruptured and inflamed. Nothing concerning.

## 2022-05-31 ENCOUNTER — Encounter: Payer: Self-pay | Admitting: General Surgery

## 2022-05-31 ENCOUNTER — Ambulatory Visit (INDEPENDENT_AMBULATORY_CARE_PROVIDER_SITE_OTHER): Payer: Medicare Other | Admitting: General Surgery

## 2022-05-31 VITALS — BP 136/76 | HR 81 | Temp 98.1°F | Resp 16 | Ht 61.75 in | Wt 187.0 lb

## 2022-05-31 DIAGNOSIS — D235 Other benign neoplasm of skin of trunk: Secondary | ICD-10-CM

## 2022-05-31 MED ORDER — DOXYCYCLINE HYCLATE 50 MG PO CAPS: 50.0000 mg | ORAL_CAPSULE | Freq: Two times a day (BID) | ORAL | 0 refills | Status: AC

## 2022-05-31 NOTE — Progress Notes (Signed)
[facility]  Incision healing but says it is draining.   BP 136/76   Pulse 81   Temp 98.1 F (36.7 C) (Oral)   Resp 16   Ht 5' 1.75" (1.568 m)   Wt 187 lb (84.8 kg)   SpO2 97%   BMI 34.48 kg/m  Incision with some redness, sutures removed, purulence expressed. Wound opened up with probing and saline flushed, packing 1/4 iodoform placed.  Patient s/p excision of cyst that was ruptured and inflamed. Incision site with infection, wound open and expressed.  Pack the area with gauze and cover with bandaid. Follow up 1/24, call if issues Doxycyline called [facility] the [facility]  Future Appointments  Date Time Provider [place]  06/15/2022  9:30 AM Virl Cagey, MD [facility] None  08/19/2022  8:15 AM [facility]-ANNUAL WELLNESS VISIT [facility] None   Curlene Labrum, MD [facility] [address] [place], [place] [address] [phone] (office)

## 2022-05-31 NOTE — Patient Instructions (Addendum)
Pack the area with gauze and cover with bandaid. Follow up 1/24, call if issues [facility]oxycyline called into the [facility]

## 2022-06-15 ENCOUNTER — Other Ambulatory Visit: Payer: Self-pay

## 2022-06-15 ENCOUNTER — Encounter: Payer: Self-pay | Admitting: General Surgery

## 2022-06-15 ENCOUNTER — Ambulatory Visit (INDEPENDENT_AMBULATORY_CARE_PROVIDER_SITE_OTHER): Payer: Medicare Other | Admitting: General Surgery

## 2022-06-15 VITALS — BP 132/74 | HR 89 | Temp 98.0°F | Resp 16 | Ht 61.75 in | Wt 189.0 lb

## 2022-06-15 DIAGNOSIS — D235 Other benign neoplasm of skin of trunk: Secondary | ICD-10-CM

## 2022-06-15 NOTE — Progress Notes (Signed)
[facility]  Packing open area from where incision was infected.  No major issues. BP 132/74   Pulse 89   Temp 98 F (36.7 C) (Oral)   Resp 16   Ht 5' 1.75" (1.568 m)   Wt 189 lb (85.7 kg)   SpO2 94%   BMI 34.85 kg/m  Wound with granulation, superficial   Patient s/p wound infection after cyst removal. Doing well. Will have some grayish drainage where I did the silver nitrate. Ok to shower without bandaid. Neosporin and bandaid daily otherwise. Ok to get your hair done. Can stop covering and doing the bandaid/neosporin once the skin is closed over the area/ will look like purplish thin skin.    Future Appointments  Date Time Provider [place]  06/28/2022  9:45 AM Virl Cagey, MD [facility] None  08/19/2022  8:15 AM [facility]-ANNUAL WELLNESS VISIT [facility] None   Curlene Labrum, MD [facility] [address] [place], [place] [address] [phone] (office)

## 2022-06-15 NOTE — Patient Instructions (Signed)
Will have some grayish drainage where I did the silver nitrate. Ok to shower without bandaid. Neosporin and bandaid daily otherwise. Ok to get your hair done. Can stop covering and doing the bandaid/neosporin once the skin is closed over the area/ will look like purplish thin skin.

## 2022-06-28 ENCOUNTER — Encounter: Payer: Self-pay | Admitting: General Surgery

## 2022-06-28 ENCOUNTER — Ambulatory Visit (INDEPENDENT_AMBULATORY_CARE_PROVIDER_SITE_OTHER): Payer: Medicare Other | Admitting: General Surgery

## 2022-06-28 VITALS — BP 136/70 | HR 85 | Temp 97.9°F | Resp 12 | Ht 61.75 in | Wt 188.0 lb

## 2022-06-28 DIAGNOSIS — D235 Other benign neoplasm of skin of trunk: Secondary | ICD-10-CM

## 2022-06-28 NOTE — Progress Notes (Signed)
[facility]  No drainage. Itching some.   BP 136/70   Pulse 85   Temp 97.9 F (36.6 C) (Oral)   Resp 12   Ht 5' 1.75" (1.568 m)   Wt 188 lb (85.3 kg)   SpO2 93%   BMI 34.66 kg/m  Posterior right neck Area healing. Some induration around the secondary incision. No erythema.  Patient s/p cyst excision and wound infection requiring packing. Doing well.  Induration- hardness around area will continue to improve over 2-3 months. Call with issues.   Curlene Labrum, MD [facility] [address] [place], [place] [address] [phone] (office)

## 2022-06-28 NOTE — Patient Instructions (Signed)
Induration- hardness around area will continue to improve over 2-3 months. Call with issues.

## 2022-07-26 ENCOUNTER — Telehealth: Payer: Self-pay | Admitting: Family Medicine

## 2022-07-26 NOTE — Telephone Encounter (Signed)
Contacted Maria Briggs to schedule their annual wellness visit. Appointment made for 08/22/2022.  Thank you,  Colletta [place],  [facility] Direct Dial ??[phone]

## 2022-08-15 LAB — HM MAMMOGRAPHY

## 2022-08-18 DIAGNOSIS — Z1231 Encounter for screening mammogram for malignant neoplasm of breast: Secondary | ICD-10-CM | POA: Diagnosis not present

## 2022-08-21 NOTE — Patient Instructions (Signed)
Maria Briggs , Thank you for taking time to come for your Medicare Wellness Visit. I appreciate your ongoing commitment to your health goals. Please review the following plan we discussed and let me know if I can assist you in the future.   These are the goals we discussed:  Goals   None     This is a list of the screening recommended for you and due dates:  Health Maintenance  Topic Date Due   Zoster (Shingles) Vaccine (1 of 2) Never done   COVID-19 Vaccine (5 - 2023-24 season) 01/21/2022   DEXA scan (bone density measurement)  08/01/2022   Mammogram  08/17/2022   Flu Shot  12/22/2022   Medicare Annual Wellness Visit  08/22/2023   Colon Cancer Screening  08/05/2026   DTaP/Tdap/Td vaccine (2 - Td or Tdap) 07/15/2030   Pneumonia Vaccine  Completed   HPV Vaccine  Aged Out   Hepatitis C Screening: USPSTF Recommendation to screen - Ages 12-79 yo.  Discontinued    Advanced directives: Please bring a copy of your health care power of attorney and living will to the office to be added to your chart at your convenience.   Conditions/risks identified: Aim for 30 minutes of exercise or brisk walking, 6-8 glasses of water, and 5 servings of fruits and vegetables each day.   Next appointment: Follow up in one year for your annual wellness visit    Preventive Care 65 Years and Older, Female Preventive care refers to lifestyle choices and visits with your health care provider that can promote health and wellness. What does preventive care include? A yearly physical exam. This is also called an annual well check. Dental exams once or twice a year. Routine eye exams. Ask your health care provider how often you should have your eyes checked. Personal lifestyle choices, including: Daily care of your teeth and gums. Regular physical activity. Eating a healthy diet. Avoiding tobacco and drug use. Limiting alcohol use. Practicing safe sex. Taking low-dose aspirin every day. Taking vitamin and  mineral supplements as recommended by your health care provider. What happens during an annual well check? The services and screenings done by your health care provider during your annual well check will depend on your age, overall health, lifestyle risk factors, and family history of disease. Counseling  Your health care provider may ask you questions about your: Alcohol use. Tobacco use. Drug use. Emotional well-being. Home and relationship well-being. Sexual activity. Eating habits. History of falls. Memory and ability to understand (cognition). Work and work Statistician. Reproductive health. Screening  You may have the following tests or measurements: Height, weight, and BMI. Blood pressure. Lipid and cholesterol levels. These may be checked every 5 years, or more frequently if you are over 64 years old. Skin check. Lung cancer screening. You may have this screening every year starting at age 34 if you have a 30-pack-year history of smoking and currently smoke or have quit within the past 15 years. Fecal occult blood test (FOBT) of the stool. You may have this test every year starting at age 42. Flexible sigmoidoscopy or colonoscopy. You may have a sigmoidoscopy every 5 years or a colonoscopy every 10 years starting at age 37. Hepatitis C blood test. Hepatitis B blood test. Sexually transmitted disease (STD) testing. Diabetes screening. This is done by checking your blood sugar (glucose) after you have not eaten for a while (fasting). You may have this done every 1-3 years. Bone density scan. This is done to  screen for osteoporosis. You may have this done starting at age 14. Mammogram. This may be done every 1-2 years. Talk to your health care provider about how often you should have regular mammograms. Talk with your health care provider about your test results, treatment options, and if necessary, the need for more tests. Vaccines  Your health care provider may recommend  certain vaccines, such as: Influenza vaccine. This is recommended every year. Tetanus, diphtheria, and acellular pertussis (Tdap, Td) vaccine. You may need a Td booster every 10 years. Zoster vaccine. You may need this after age 56. Pneumococcal 13-valent conjugate (PCV13) vaccine. One dose is recommended after age 32. Pneumococcal polysaccharide (PPSV23) vaccine. One dose is recommended after age 84. Talk to your health care provider about which screenings and vaccines you need and how often you need them. This information is not intended to replace advice given to you by your health care provider. Make sure you discuss any questions you have with your health care provider. Document Released: 06/05/2015 Document Revised: 01/27/2016 Document Reviewed: 03/10/2015 Elsevier Interactive Patient Education  [address] Prevention in the Home Falls can cause injuries. They can happen to people of all ages. There are many things you can do to make your home safe and to help prevent falls. What can I do on the outside of my home? Regularly fix the edges of walkways and driveways and fix any cracks. Remove anything that might make you trip as you walk through a door, such as a raised step or threshold. Trim any bushes or trees on the path to your home. Use bright outdoor lighting. Clear any walking paths of anything that might make someone trip, such as rocks or tools. Regularly check to see if handrails are loose or broken. Make sure that both sides of any steps have handrails. Any raised decks and porches should have guardrails on the edges. Have any leaves, snow, or ice cleared regularly. Use sand or salt on walking paths during winter. Clean up any spills in your garage right away. This includes oil or grease spills. What can I do in the bathroom? Use night lights. Install grab bars by the toilet and in the tub and shower. Do not use towel bars as grab bars. Use non-skid mats or  decals in the tub or shower. If you need to sit down in the shower, use a plastic, non-slip stool. Keep the floor dry. Clean up any water that spills on the floor as soon as it happens. Remove soap buildup in the tub or shower regularly. Attach bath mats securely with double-sided non-slip rug tape. Do not have throw rugs and other things on the floor that can make you trip. What can I do in the bedroom? Use night lights. Make sure that you have a light by your bed that is easy to reach. Do not use any sheets or blankets that are too big for your bed. They should not hang down onto the floor. Have a firm chair that has side arms. You can use this for support while you get dressed. Do not have throw rugs and other things on the floor that can make you trip. What can I do in the kitchen? Clean up any spills right away. Avoid walking on wet floors. Keep items that you use a lot in easy-to-reach places. If you need to reach something above you, use a strong step stool that has a grab bar. Keep electrical cords out of the way.  Do not use floor polish or wax that makes floors slippery. If you must use wax, use non-skid floor wax. Do not have throw rugs and other things on the floor that can make you trip. What can I do with my stairs? Do not leave any items on the stairs. Make sure that there are handrails on both sides of the stairs and use them. Fix handrails that are broken or loose. Make sure that handrails are as long as the stairways. Check any carpeting to make sure that it is firmly attached to the stairs. Fix any carpet that is loose or worn. Avoid having throw rugs at the top or bottom of the stairs. If you do have throw rugs, attach them to the floor with carpet tape. Make sure that you have a light switch at the top of the stairs and the bottom of the stairs. If you do not have them, ask someone to add them for you. What else can I do to help prevent falls? Wear shoes that: Do not  have high heels. Have rubber bottoms. Are comfortable and fit you well. Are closed at the toe. Do not wear sandals. If you use a stepladder: Make sure that it is fully opened. Do not climb a closed stepladder. Make sure that both sides of the stepladder are locked into place. Ask someone to hold it for you, if possible. Clearly mark and make sure that you can see: Any grab bars or handrails. First and last steps. Where the edge of each step is. Use tools that help you move around (mobility aids) if they are needed. These include: Canes. Walkers. Scooters. Crutches. Turn on the lights when you go into a dark area. Replace any light bulbs as soon as they burn out. Set up your furniture so you have a clear path. Avoid moving your furniture around. If any of your floors are uneven, fix them. If there are any pets around you, be aware of where they are. Review your medicines with your doctor. Some medicines can make you feel dizzy. This can increase your chance of falling. Ask your doctor what other things that you can do to help prevent falls. This information is not intended to replace advice given to you by your health care provider. Make sure you discuss any questions you have with your health care provider. Document Released: 03/05/2009 Document Revised: 10/15/2015 Document Reviewed: 06/13/2014 Elsevier Interactive Patient Education  2017 [employer].

## 2022-08-21 NOTE — Progress Notes (Unsigned)
Subjective:   Maria Briggs is a 72 y.o. female who presents for Medicare Annual (Subsequent) preventive examination.  Review of Systems    ***       Objective:    There were no vitals filed for this visit. There is no height or weight on file to calculate BMI.     08/17/2021    9:29 AM 07/22/2020    9:07 AM 04/08/2019   12:52 PM 03/24/2017    7:51 AM 02/20/2015   10:27 PM 02/11/2015    3:04 PM  Advanced Directives  Does Patient Have a Medical Advance Directive? Yes Yes Yes Yes Yes Yes  Type of Paramedic of [place];Living will [place];Living will;Out of facility DNR (pink MOST or yellow form)  [place];Living will Living will;Healthcare Power of Attorney Living will;Healthcare Power of Attorney  Does patient want to make changes to medical advance directive?  No - Patient declined   No - Patient declined No - Patient declined  Copy of [place] in Chart? No - copy requested No - copy requested   No - copy requested No - copy requested    Current Medications (verified) Outpatient Encounter Medications as of 08/22/2022  Medication Sig   amLODipine (NORVASC) 10 MG tablet Take 1 tablet (10 mg total) by mouth daily.   aspirin 81 MG tablet Take 81 mg by mouth daily.   Biotin 5 MG CAPS Take 1 capsule by mouth daily.    estradiol (ESTRACE) 0.1 MG/GM vaginal cream Place 1 Applicatorful vaginally every other day.   meloxicam (MOBIC) 15 MG tablet TAKE 1/2 TO 1 TABLET (7.5-15 MG TOTAL) BY MOUTH DAILY   minoxidil (ROGAINE) 2 % external solution Apply topically 2 (two) times daily.   NEXLIZET 180-10 MG TABS Take 1 tablet by mouth daily.   Omega-3 Fatty Acids (FISH OIL PO) Take 1,000 mg by mouth 2 (two) times daily.   polyethylene glycol powder (GLYCOLAX/MIRALAX) 17 GM/SCOOP powder Take 17 g by mouth as needed.   Wheat Dextrin (BENEFIBER DRINK MIX PO)    [DISCONTINUED] Peppermint Oil (IBGARD) 90 MG CPCR     No facility-administered encounter medications on file as of 08/22/2022.    Allergies (verified) Penicillins, Rosuvastatin calcium, and Statins   History: Past Medical History:  Diagnosis Date   Allergy    Arthritis    "maybe a little; comes & goes; various parts of my body" (02/20/2015)   Hyperlipidemia    Hypertension    PONV (postoperative nausea and vomiting)    Past Surgical History:  Procedure Laterality Date   BACK SURGERY     BLADDER SUSPENSION  ~ 2005   CHOLECYSTECTOMY  05/24/1971   COLONOSCOPY     COLONOSCOPY W/ BIOPSIES AND POLYPECTOMY     ENDOMETRIAL ABLATION  2006?   EYE SURGERY     HERNIA REPAIR     INCISIONAL HERNIA REPAIR N/A 02/20/2015   Procedure: LAPAROSCOPIC INCISIONAL HERNIA REPAIR WITH MESH;  Surgeon: Coralie Keens, MD;  Location: [facility];  Service: General;  Laterality: N/A;   INSERTION OF MESH N/A 02/20/2015   Procedure: INSERTION OF MESH;  Surgeon: Coralie Keens, MD;  Location: [facility];  Service: General;  Laterality: N/A;   LAPAROSCOPIC INCISIONAL / UMBILICAL / [place]  02/20/2015   incisional w/mesh   LUMBAR [place] SURGERY  05/24/1991   MOLE REMOVAL  01/22/1999   "off my back; no cancer"   [place]  Ruptured disc lower back   TONSILLECTOMY  05/23/1957   TUBAL LIGATION  05/23/1977   Family History  Problem Relation Age of Onset   Colon polyps Father    Colon cancer Father        ?   Prostate cancer Father    Dementia Father    COPD Father    Cancer Father    Anxiety disorder Mother    COPD Mother    Depression Mother    Heart attack Brother 41   Mood Disorder Son    Stroke Maternal Grandfather    Stroke Paternal Grandmother    Diabetes Paternal Grandmother    Stroke Paternal Grandfather    Diabetes Paternal Grandfather    Esophageal cancer Neg Hx    Stomach cancer Neg Hx    Rectal cancer Neg Hx    Social History   Socioeconomic History   Marital status: Married    Spouse name: Coralyn Mark   Number of  children: 2   Years of education: Not on file   Highest education level: High school graduate  Occupational History   Occupation: retired  Tobacco Use   Smoking status: Never   Smokeless tobacco: Never  Vaping Use   Vaping Use: Never used  Substance and Sexual Activity   Alcohol use: Not Currently    Comment: 02/20/2015 "might have a drink q 6 months"   Drug use: No   Sexual activity: Not Currently  Other Topics Concern   Not on file  Social History Narrative   Retired, lives with husband Coralyn Mark, 2 children, enjoys reading and puzzles   Social Determinants of Health   Financial Resource Strain: Low Risk  (08/17/2021)   Overall Financial Resource Strain (CARDIA)    Difficulty of Paying Living Expenses: Not hard at all  Food Insecurity: No Food Insecurity (08/17/2021)   Hunger Vital Sign    Worried About Running Out of Food in the Last Year: Never true    Ran Out of Food in the Last Year: Never true  Transportation Needs: No Transportation Needs (08/17/2021)   PRAPARE - Hydrologist (Medical): No    Lack of Transportation (Non-Medical): No  Physical Activity: Insufficiently Active (08/17/2021)   Exercise Vital Sign    Days of Exercise per Week: 7 days    Minutes of Exercise per Session: 20 min  Stress: No Stress Concern Present (08/17/2021)   [place]    Feeling of Stress : Only a little  Social Connections: Socially Integrated (08/17/2021)   Social Connection and Isolation Panel [NHANES]    Frequency of Communication with Friends and Family: More than three times a week    Frequency of Social Gatherings with Friends and Family: Once a week    Attends Religious Services: 1 to 4 times per year    Active Member of [employer] or Organizations: Yes    Attends Archivist Meetings: 1 to 4 times per year    Marital Status: Married    Tobacco Counseling Counseling given: Not  Answered   Clinical Intake:              How often do you need to have someone help you when you read instructions, pamphlets, or other written materials from your doctor or pharmacy?: (P) 1 - Never  Diabetic?No          Activities of Daily Living    08/18/2022    9:07  AM  In your present state of health, do you have any difficulty performing the following activities:  Hearing? 1  Vision? 0  Difficulty concentrating or making decisions? 0  Walking or climbing stairs? 0  Dressing or bathing? 0  Doing errands, shopping? 0  Preparing Food and eating ? N  Using the Toilet? N  In the past six months, have you accidently leaked urine? Y  Do you have problems with loss of bowel control? N  Managing your Medications? N  Managing your Finances? N  Housekeeping or managing your Housekeeping? N    Patient Care Team: [name], DO as PCP - General (Family Medicine) Lavera Guise, [facility] as Pharmacist (Family Medicine) Allyn Kenner, MD (Dermatology) Irene Shipper, MD as Consulting Physician (Gastroenterology) [facility]  Indicate any recent Medical Services you may have received from other than [facility] providers in the past year (date may be approximate).     Assessment:   This is a routine wellness examination for [place].  Hearing/Vision screen No results found.  Dietary issues and exercise activities discussed:     Goals Addressed   None    Depression Screen    04/05/2022    8:29 AM 12/31/2021    8:06 AM 08/30/2021    9:12 AM 08/17/2021    9:11 AM 05/26/2021    9:11 AM 01/18/2021    9:09 AM 01/13/2021    9:43 AM  PHQ 2/9 Scores  PHQ - 2 Score 0 0 0 0 0 0 0    Fall Risk    08/18/2022    9:07 AM 04/19/2022    9:43 AM 04/05/2022    8:22 AM 12/31/2021    8:06 AM 08/30/2021    9:12 AM  Fall Risk   Falls in the past year? 0 0 0 0 0  Follow up  Falls evaluation completed       FALL RISK PREVENTION PERTAINING TO THE HOME:  Any  stairs in or around the home? {YES/NO:21197} If so, are there any without handrails? {YES/NO:21197} Home free of loose throw rugs in walkways, pet beds, electrical cords, etc? {YES/NO:21197} Adequate lighting in your home to reduce risk of falls? {YES/NO:21197}  ASSISTIVE DEVICES UTILIZED TO PREVENT FALLS:  Life alert? {YES/NO:21197} Use of a cane, walker or w/c? {YES/NO:21197} Grab bars in the bathroom? {YES/NO:21197} Shower chair or bench in shower? {YES/NO:21197} Elevated toilet seat or a handicapped toilet? {YES/NO:21197}  TIMED UP AND GO:  Was the test performed? No . Telephonic visit   Cognitive Function:        07/22/2020    9:10 AM  6CIT Screen  What Year? 0 points  What month? 0 points  What time? 0 points  Count back from 20 0 points  Months in reverse 0 points  Repeat phrase 0 points  Total Score 0 points    Immunizations Immunization History  Administered Date(s) Administered   Fluad Quad(high Dose 65+) 02/15/2019, 03/16/2020, 03/04/2021, 04/05/2022   Influenza Split 03/25/2013   Influenza, High Dose Seasonal PF 03/10/2017   Influenza, Seasonal, Injecte, Preservative Fre 02/21/2015, 03/02/2016   Influenza,trivalent, recombinat, inj, PF 03/25/2013   Influenza-Unspecified 02/21/2015, 03/02/2016   PFIZER(Purple Top)SARS-COV-2 Vaccination 06/12/2019, 07/03/2019, 04/01/2020   PNEUMOCOCCAL CONJUGATE-20 12/31/2021   Pfizer Covid-19 Vaccine Bivalent Booster 40yrs & up 04/07/2021   Pneumococcal Polysaccharide-23 02/15/2018   Tdap 07/15/2020    TDAP status: Up to date  Flu Vaccine status: Up to date  Pneumococcal vaccine status: Up to  date  Covid-19 vaccine status: Information provided on how to obtain vaccines.   Qualifies for Shingles Vaccine? Yes   Zostavax completed No   Shingrix Completed?: No.    Education has been provided regarding the importance of this vaccine. Patient has been advised to call insurance company to determine out of pocket expense if  they have not yet received this vaccine. Advised may also receive vaccine at local pharmacy or Health Dept. Verbalized acceptance and understanding.  Screening Tests Health Maintenance  Topic Date Due   Zoster Vaccines- Shingrix (1 of 2) Never done   COVID-19 Vaccine (5 - 2023-24 season) 01/21/2022   DEXA SCAN  08/01/2022   MAMMOGRAM  08/17/2022   Medicare Annual Wellness (AWV)  08/18/2022   COLONOSCOPY (Pts 45-73yrs Insurance coverage will need to be confirmed)  08/05/2026   DTaP/Tdap/Td (2 - Td or Tdap) 07/15/2030   Pneumonia Vaccine 55+ Years old  Completed   INFLUENZA VACCINE  Completed   HPV VACCINES  Aged Out   Hepatitis C Screening  Discontinued    Health Maintenance  Health Maintenance Due  Topic Date Due   Zoster Vaccines- Shingrix (1 of 2) Never done   COVID-19 Vaccine (5 - 2023-24 season) 01/21/2022   DEXA SCAN  08/01/2022   MAMMOGRAM  08/17/2022   Medicare Annual Wellness (AWV)  08/18/2022    Colorectal cancer screening: Type of screening: Colonoscopy. Completed 08/04/21. Repeat every 5 years  Mammogram status: Completed 08/18/22. Repeat every year  {Bone Density status:[id number]}  Lung Cancer Screening: (Low Dose CT Chest recommended if Age 70-80 years, 30 pack-year currently smoking OR have quit w/in 15years.) does not qualify.   Lung Cancer Screening Referral: n/a  Additional Screening:  Hepatitis C Screening: does not qualify;  Vision Screening: Recommended annual ophthalmology exams for early detection of glaucoma and other disorders of the eye. Is the patient up to date with their annual eye exam?  Yes  Who is the provider or what is the name of the office in which the patient attends annual eye exams? Dr.Groat  If pt is not established with a provider, would they like to be referred to a provider to establish care? No .   Dental Screening: Recommended annual dental exams for proper oral hygiene  Community Resource Referral / Chronic Care  Management: CRR required this visit?  {YES/NO:21197}  CCM required this visit?  {YES/NO:21197}     Plan:     I have personally reviewed and noted the following in the patient's chart:   Medical and social history Use of alcohol, tobacco or illicit drugs  Current medications and supplements including opioid prescriptions. {Opioid Prescriptions:[phone]} Functional ability and status Nutritional status Physical activity Advanced directives List of other physicians Hospitalizations, surgeries, and ER visits in previous 12 months Vitals Screenings to include cognitive, depression, and falls Referrals and appointments  In addition, I have reviewed and discussed with patient certain preventive protocols, quality metrics, and best practice recommendations. A written personalized care plan for preventive services as well as general preventive health recommendations were provided to patient.     Vanetta Mulders, [place]   [id number]   Due to this being a virtual visit, the after visit summary with patients personalized plan was offered to patient via mail or my-chart. ***Patient declined at this time./ Patient would like to access on my-chart/ per request, patient was mailed a copy of AVS./ Patient preferred to pick up at office at next visit  Nurse Notes: ***

## 2022-08-22 ENCOUNTER — Ambulatory Visit (INDEPENDENT_AMBULATORY_CARE_PROVIDER_SITE_OTHER): Payer: Medicare Other

## 2022-08-22 VITALS — Ht 61.75 in | Wt 188.0 lb

## 2022-08-22 DIAGNOSIS — Z0000 Encounter for general adult medical examination without abnormal findings: Secondary | ICD-10-CM | POA: Diagnosis not present

## 2022-09-01 ENCOUNTER — Telehealth: Payer: Self-pay | Admitting: Family Medicine

## 2022-09-01 NOTE — Telephone Encounter (Signed)
Patient has appt 5/7 and would like to have her lab work completed the week before. Please add and call her back when it is in.

## 2022-09-05 DIAGNOSIS — D485 Neoplasm of uncertain behavior of skin: Secondary | ICD-10-CM | POA: Diagnosis not present

## 2022-09-05 DIAGNOSIS — D2272 Melanocytic nevi of left lower limb, including hip: Secondary | ICD-10-CM | POA: Diagnosis not present

## 2022-09-05 DIAGNOSIS — X32XXXD Exposure to sunlight, subsequent encounter: Secondary | ICD-10-CM | POA: Diagnosis not present

## 2022-09-05 DIAGNOSIS — L570 Actinic keratosis: Secondary | ICD-10-CM | POA: Diagnosis not present

## 2022-09-05 DIAGNOSIS — Z1283 Encounter for screening for malignant neoplasm of skin: Secondary | ICD-10-CM | POA: Diagnosis not present

## 2022-09-05 DIAGNOSIS — D225 Melanocytic nevi of trunk: Secondary | ICD-10-CM | POA: Diagnosis not present

## 2022-09-09 IMAGING — CT CT ABD-PELV W/ CM
2 of 5 series · 16 of 46 positions shown, 18 images · IV contrast (Omnipaque or Isovue)
Comparison: Noncontrast CT on 02/03/2021

CLINICAL DATA: Left-sided flank and back pain for 6 months.
Personal history of diverticulitis.

EXAM:
CT ABDOMEN AND PELVIS WITH CONTRAST
TECHNIQUE: Multidetector CT imaging of the abdomen and pelvis was performed
using the standard protocol following bolus administration of
intravenous contrast.

[Series 2: axial st · axial · 0.88mm/px · z∈[+1674,+2079]mm · 13 of 93 slices shown, 15 images]
[im 6/93  soft-tissue]
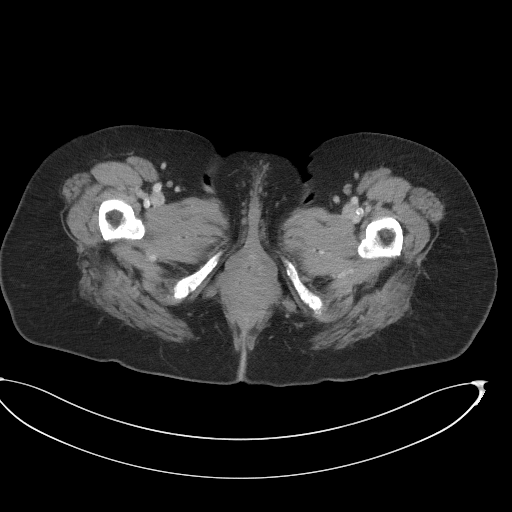
[im 6/93  bone]
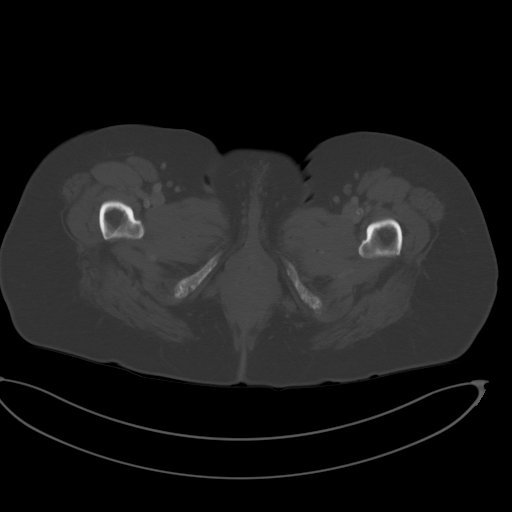
[im 11/93  soft-tissue]
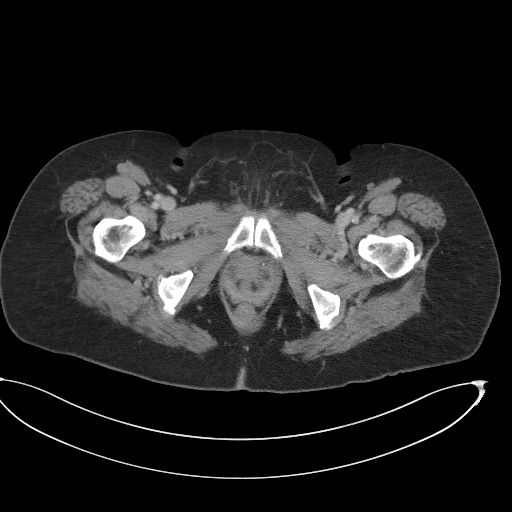
[im 22/93  soft-tissue]
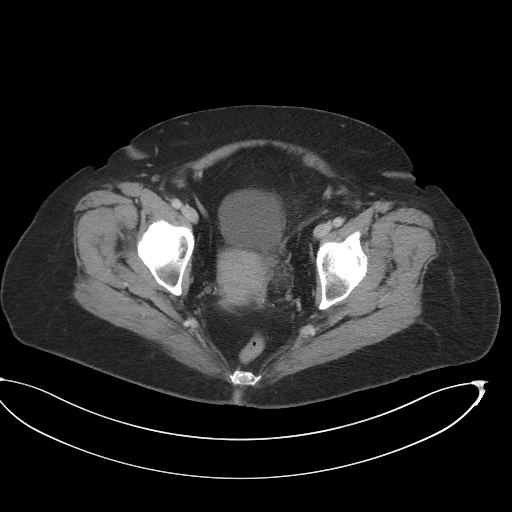
[im 28/93  soft-tissue]
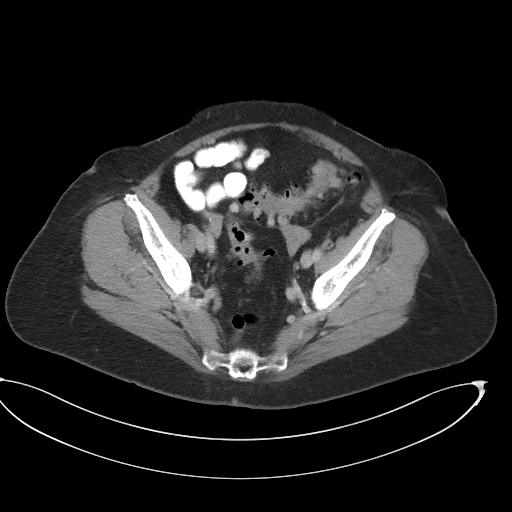
[im 33/93  soft-tissue]
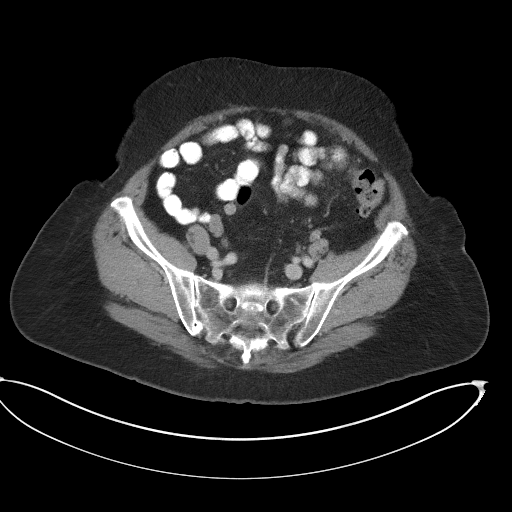
[im 38/93  soft-tissue]
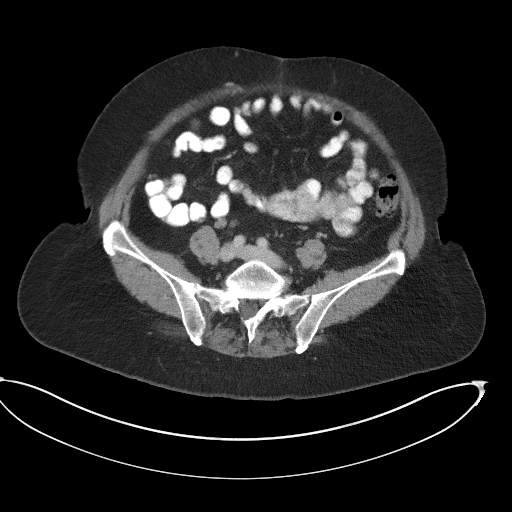
[im 49/93  soft-tissue]
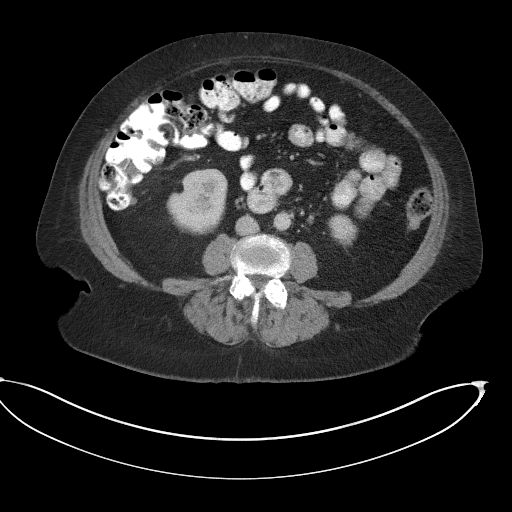
[im 55/93  soft-tissue]
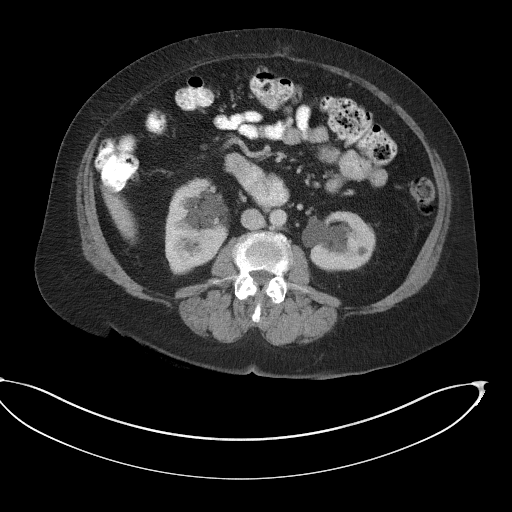
[im 60/93  soft-tissue]
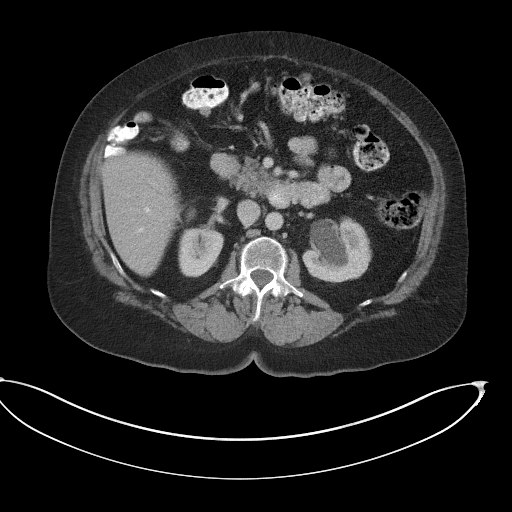
[im 60/93  bone]
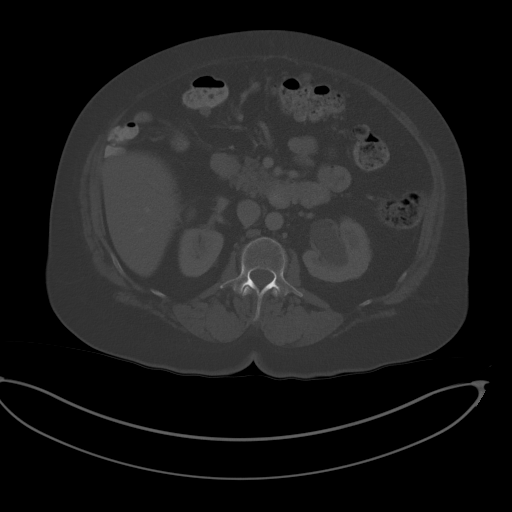
[im 65/93  soft-tissue]
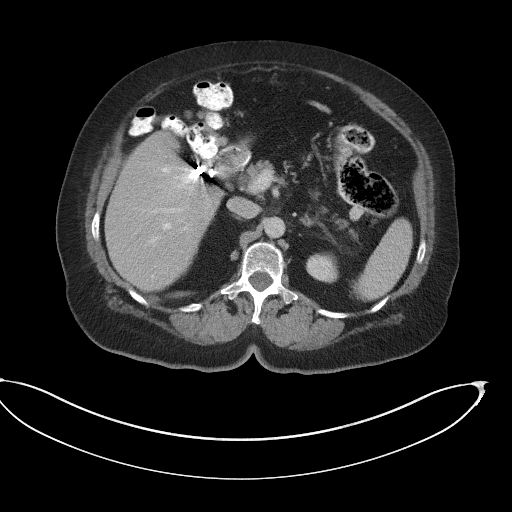
[im 71/93  soft-tissue]
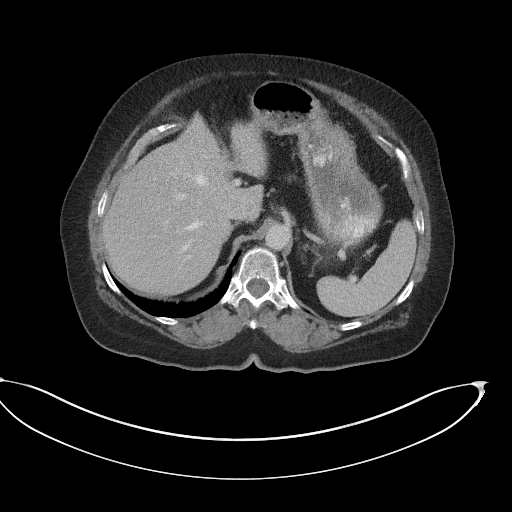
[im 82/93  soft-tissue]
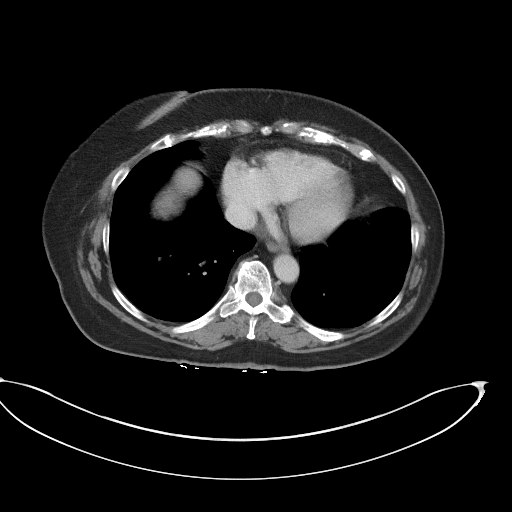
[im 87/93  soft-tissue]
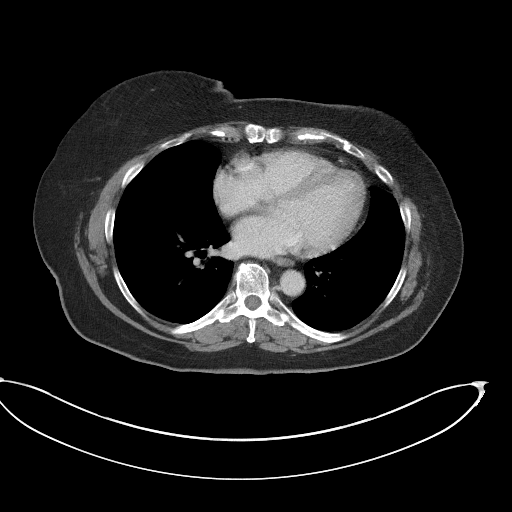

[Series 6: coronal st · coronal · 0.81mm/px · 3 of 117 slices shown]
[im 39/117  soft-tissue]
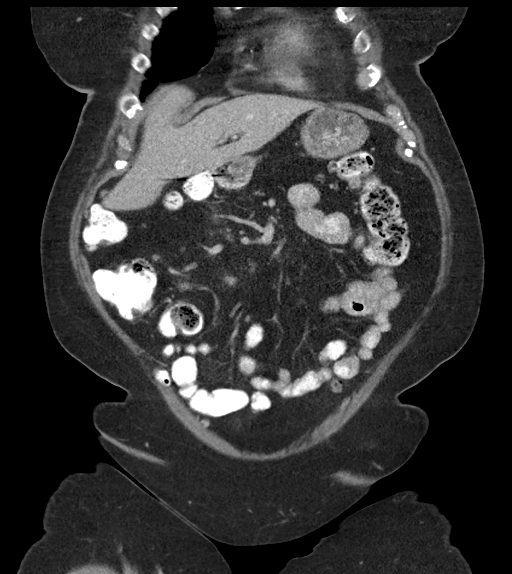
[im 52/117  soft-tissue]
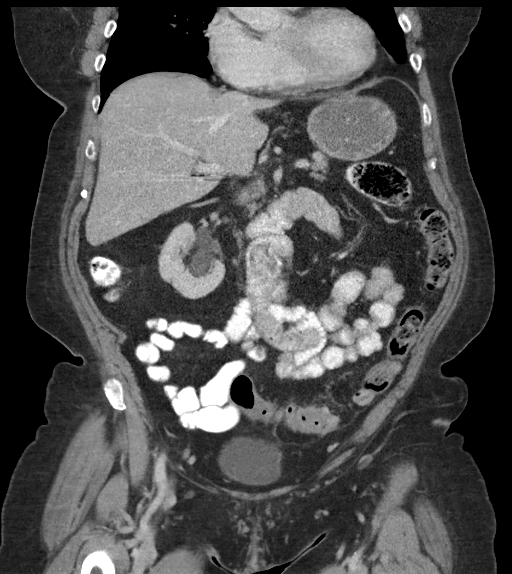
[im 65/117  soft-tissue]
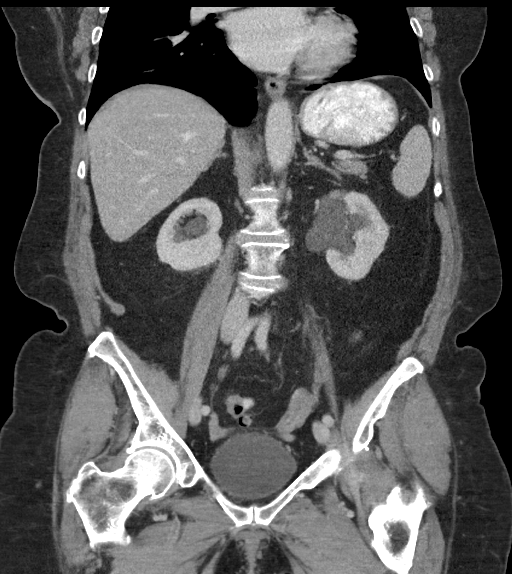

[16 of 46 positions shown; findings below may reference images not displayed]

RADIATION DOSE REDUCTION: This exam was performed according to the
departmental dose-optimization program which includes automated
exposure control, adjustment of the mA and/or kV according to
patient size and/or use of iterative reconstruction technique.

CONTRAST:  100mL OMNIPAQUE IOHEXOL 300 MG/ML  SOLN
FINDINGS: Lower Chest: No acute findings.

Hepatobiliary: No hepatic masses identified. Tiny cyst again noted
in posterior left hepatic lobe. Prior cholecystectomy. No evidence
of biliary obstruction.

Pancreas:  No mass or inflammatory changes.

Spleen: Within normal limits in size and appearance.

Adrenals/Urinary Tract: No masses identified. A few tiny sub-cm left
renal cysts are seen. Bilateral renal sinus cysts again noted. (no
followup imaging is recommended.) 2 mm calculus noted in upper pole
of left kidney. No evidence of ureteral calculi or hydronephrosis.

Stomach/Bowel: No evidence of obstruction, inflammatory process or
abnormal fluid collections. Normal appendix visualized. Diffuse
colonic diverticulosis again seen. Chronic muscular hypertrophy
involving the sigmoid colon is unchanged in appearance. No evidence
of acute diverticulitis or abscess.

Vascular/Lymphatic: No pathologically enlarged lymph nodes. No acute
vascular findings.

Reproductive:  No mass or other significant abnormality.

Other:  Stable tiny paraumbilical hernia, which contains only fat.

Musculoskeletal:  No suspicious bone lesions identified.
IMPRESSION: Tiny left renal calculus. No evidence of ureteral calculi,
hydronephrosis, or other acute findings.

Colonic diverticulosis, without radiographic evidence of acute
diverticulitis.

Stable tiny paraumbilical hernia containing only fat.

## 2022-09-27 ENCOUNTER — Ambulatory Visit (INDEPENDENT_AMBULATORY_CARE_PROVIDER_SITE_OTHER): Payer: Medicare Other | Admitting: Family Medicine

## 2022-09-27 ENCOUNTER — Encounter: Payer: Self-pay | Admitting: Family Medicine

## 2022-09-27 VITALS — BP 139/78 | HR 93 | Ht 61.0 in | Wt 183.0 lb

## 2022-09-27 DIAGNOSIS — Z6834 Body mass index (BMI) 34.0-34.9, adult: Secondary | ICD-10-CM

## 2022-09-27 DIAGNOSIS — M25512 Pain in left shoulder: Secondary | ICD-10-CM

## 2022-09-27 DIAGNOSIS — I700 Atherosclerosis of aorta: Secondary | ICD-10-CM | POA: Diagnosis not present

## 2022-09-27 DIAGNOSIS — N3000 Acute cystitis without hematuria: Secondary | ICD-10-CM

## 2022-09-27 DIAGNOSIS — R7309 Other abnormal glucose: Secondary | ICD-10-CM | POA: Diagnosis not present

## 2022-09-27 DIAGNOSIS — M8588 Other specified disorders of bone density and structure, other site: Secondary | ICD-10-CM

## 2022-09-27 DIAGNOSIS — M25511 Pain in right shoulder: Secondary | ICD-10-CM | POA: Diagnosis not present

## 2022-09-27 DIAGNOSIS — E039 Hypothyroidism, unspecified: Secondary | ICD-10-CM | POA: Diagnosis not present

## 2022-09-27 DIAGNOSIS — E669 Obesity, unspecified: Secondary | ICD-10-CM

## 2022-09-27 DIAGNOSIS — I10 Essential (primary) hypertension: Secondary | ICD-10-CM

## 2022-09-27 DIAGNOSIS — R300 Dysuria: Secondary | ICD-10-CM | POA: Diagnosis not present

## 2022-09-27 LAB — CMP14+EGFR

## 2022-09-27 LAB — URINALYSIS
Bilirubin, UA: NEGATIVE
Glucose, UA: NEGATIVE
Ketones, UA: NEGATIVE
Nitrite, UA: POSITIVE — AB
Specific Gravity, UA: 1.02 (ref 1.005–1.030)
Urobilinogen, Ur: 0.2 mg/dL (ref 0.2–1.0)
pH, UA: 6.5 (ref 5.0–7.5)

## 2022-09-27 LAB — BAYER DCA HB A1C WAIVED: HB A1C (BAYER DCA - WAIVED): 5.4 % (ref 4.8–5.6)

## 2022-09-27 MED ORDER — NITROFURANTOIN MONOHYD MACRO 100 MG PO CAPS
100.0000 mg | ORAL_CAPSULE | Freq: Two times a day (BID) | ORAL | 0 refills | Status: AC
Start: 2022-09-27 — End: 2022-10-02

## 2022-09-27 MED ORDER — METHYLPREDNISOLONE ACETATE 40 MG/ML IJ SUSP
40.0000 mg | Freq: Once | INTRAMUSCULAR | Status: DC
Start: 2022-09-27 — End: 2022-09-27

## 2022-09-27 MED ORDER — FLUCONAZOLE 150 MG PO TABS
150.0000 mg | ORAL_TABLET | Freq: Once | ORAL | 0 refills | Status: AC
Start: 2022-09-27 — End: 2022-09-27

## 2022-09-27 MED ORDER — METHYLPREDNISOLONE ACETATE 40 MG/ML IJ SUSP
40.0000 mg | Freq: Once | INTRAMUSCULAR | Status: AC
Start: 2022-09-27 — End: 2022-09-27
  Administered 2022-09-27: 40 mg via INTRA_ARTICULAR

## 2022-09-27 NOTE — Progress Notes (Signed)
Subjective: CC:HTN follow up PCP: Raliegh Ip, DO [id number] P Ripberger is a 72 y.o. female presenting to clinic today for:  1. HTN associated with hyperlipidemia/ Aortic atherosclerosis Patient reports compliance with her amlodipine.  She is tolerating the Nexlizet without difficulty.  Certainly not having any myalgia type symptoms like she did with statin.  No chest pain, shortness of breath, blurred vision reported.  She does report urinary frequency and thinks that she might have a urinary tract infection because it is cloudy and has some odor to it.  No hematuria or fevers reported  2.  Right shoulder pain Patient reports that she has struggled with chronic right shoulder pain.  She reports pain with lifting above 90 degrees.  She had some meloxicam which was somewhat helpful.  She has had issues with bursitis on the left-hand side causing some carpal tunnel that was treated by a specialist in [place]   ROS: Per HPI  Allergies  Allergen Reactions   Penicillins Hives   Rosuvastatin Calcium Other (See Comments)    Muscle pain, weakness in limbs   Statins Other (See Comments)    Muscle pain, weakness in limbs   Past Medical History:  Diagnosis Date   Allergy    Arthritis    "maybe a little; comes & goes; various parts of my body" (02/20/2015)   Hyperlipidemia    Hypertension    PONV (postoperative nausea and vomiting)     Current Outpatient Medications:    amLODipine (NORVASC) 10 MG tablet, Take 1 tablet (10 mg total) by mouth daily., Disp: 90 tablet, Rfl: 3   aspirin 81 MG tablet, Take 81 mg by mouth daily., Disp: , Rfl:    Biotin 5 MG CAPS, Take 1 capsule by mouth daily. , Disp: , Rfl:    estradiol (ESTRACE) 0.1 MG/GM vaginal cream, Place 1 Applicatorful vaginally every other day., Disp: , Rfl:    meloxicam (MOBIC) 15 MG tablet, TAKE 1/2 TO 1 TABLET (7.5-15 MG TOTAL) BY MOUTH DAILY, Disp: 30 tablet, Rfl: 2   minoxidil (ROGAINE) 2 % external solution, Apply  topically 2 (two) times daily., Disp: , Rfl:    NEXLIZET 180-10 MG TABS, Take 1 tablet by mouth daily., Disp: 90 tablet, Rfl: 3   Omega-3 Fatty Acids (FISH OIL PO), Take 1,000 mg by mouth 2 (two) times daily., Disp: , Rfl:    polyethylene glycol powder (GLYCOLAX/MIRALAX) 17 GM/SCOOP powder, Take 17 g by mouth as needed., Disp: , Rfl:    Wheat Dextrin (BENEFIBER DRINK MIX PO), , Disp: , Rfl:  Social History   Socioeconomic History   Marital status: Married    Spouse name: Aurther Loft   Number of children: 2   Years of education: Not on file   Highest education level: 12th grade  Occupational History   Occupation: retired  Tobacco Use   Smoking status: Never   Smokeless tobacco: Never  Vaping Use   Vaping Use: Never used  Substance and Sexual Activity   Alcohol use: Not Currently    Comment: 02/20/2015 "might have a drink q 6 months"   Drug use: No   Sexual activity: Not Currently  Other Topics Concern   Not on file  Social History Narrative   Retired, lives with husband Aurther Loft, 2 children, enjoys reading and puzzles   Social Determinants of Health   Financial Resource Strain: Low Risk  (08/22/2022)   Overall Financial Resource Strain (CARDIA)    Difficulty of Paying Living Expenses: Not hard at  all  Food Insecurity: No Food Insecurity (09/23/2022)   Hunger Vital Sign    Worried About Running Out of Food in the Last Year: Never true    Ran Out of Food in the Last Year: Never true  Transportation Needs: No Transportation Needs (09/23/2022)   PRAPARE - Administrator, Civil Service (Medical): No    Lack of Transportation (Non-Medical): No  Physical Activity: Insufficiently Active (09/23/2022)   Exercise Vital Sign    Days of Exercise per Week: 1 day    Minutes of Exercise per Session: 60 min  Stress: No Stress Concern Present (09/23/2022)   [employer] of Occupational Health - Occupational Stress Questionnaire    Feeling of Stress : Only a little  Social Connections:  Moderately Integrated (09/23/2022)   Social Connection and Isolation Panel [NHANES]    Frequency of Communication with Friends and Family: More than three times a week    Frequency of Social Gatherings with Friends and Family: Once a week    Attends Religious Services: 1 to 4 times per year    Active Member of [employer] or Organizations: No    Attends Banker Meetings: Never    Marital Status: Married  Catering manager Violence: Not At Risk (08/22/2022)   Humiliation, Afraid, Rape, and Kick questionnaire    Fear of Current or Ex-Partner: No    Emotionally Abused: No    Physically Abused: No    Sexually Abused: No   Family History  Problem Relation Age of Onset   Colon polyps Father    Colon cancer Father        ?   Prostate cancer Father    Dementia Father    COPD Father    Cancer Father    Anxiety disorder Mother    COPD Mother    Depression Mother    Heart attack Brother 20   Mood Disorder Son    Stroke Maternal Grandfather    Diabetes Maternal Grandfather    Stroke Paternal Grandmother    Diabetes Paternal Grandmother    Stroke Paternal Grandfather    Diabetes Paternal Grandfather    Esophageal cancer Neg Hx    Stomach cancer Neg Hx    Rectal cancer Neg Hx     Objective: Office vital signs reviewed. BP 139/78   Pulse 93   Ht 5\' 1"  (1.549 m)   Wt 183 lb (83 kg)   SpO2 96%   BMI 34.58 kg/m   Physical Examination:  General: Awake, alert, well nourished, No acute distress HEENT: sclera. MMM Cardio: regular rate and rhythm, S1S2 heard, no murmurs appreciated Pulm: clear to auscultation bilaterally, no wheezes, rhonchi or rales; normal work of breathing on room air MSK:  Right shoulder; painful arc sign present.  She has no tenderness palpation to the shoulder including AC joint.  She has negative Hawkins.  JOINT INJECTION:  Patient denies allergy to antiseptics (including iodine) and anesthetics.  Patient denies h/o diabetes, frequent steroid use, use of  blood thinners/ antiplatelets.  Patient was given informed consent and a signed copy has been placed in the chart. Appropriate time out was taken. Area prepped and draped in usual sterile fashion. Anatomic landmarks were identified and injection site was marked.  Ethyl chloride spray was used to numb the area and 1 cc of methylprednisolone 40 mg/ml plus  3 cc of 1% lidocaine without epinephrine was injected into the right shoulder using a(n) posterior approach. The patient tolerated the procedure well  and there were no immediate complications. Estimated blood loss is less than 1 cc.  Post procedure instructions were reviewed and handout outlining these instructions were provided to patient.  Injection was observed by nurse practitioner Neale Burly   Assessment/ Plan: 72 y.o. female   Essential hypertension  Acute cystitis without hematuria - Plan: Urine Culture, Urinalysis, CBC with Differential, nitrofurantoin, macrocrystal-monohydrate, (MACROBID) 100 MG capsule, fluconazole (DIFLUCAN) 150 MG tablet  Osteopenia of lumbar spine - Plan: CMP14+EGFR, VITAMIN D 25 Hydroxy (Vit-D Deficiency, Fractures), DG [facility] DEXA  Aortic atherosclerosis (HCC) - Plan: TSH, CMP14+EGFR  Obesity (BMI 30-39.9) - Plan: Bayer DCA Hb A1c Waived  Acute pain of right shoulder - Plan: methylPREDNISolone acetate (DEPO-MEDROL) injection 40 mg, DISCONTINUED: methylPREDNISolone acetate (DEPO-MEDROL) injection 40 mg  Blood pressure is controlled.  No changes needed.  Continue Norvasc.  Continue Nexlizet for cholesterol  Due for DEXA scan given history of osteopenia.  Last DEXA March 2022.  Check vitamin D, calcium  Urine was consistent with acute cystitis.  Macrobid ordered.  Diflucan given for as needed use.  Follow-up as needed  A1c did not demonstrate diabetes.  Corticosteroid injection administered into the right shoulder in efforts to alleviate her pain.  We discussed that if not helpful will plan for referral  back to her orthopedist.  We discussed red flag signs and symptoms and reasons for reevaluation.  She will follow-up as needed  No orders of the defined types were placed in this encounter.  No orders of the defined types were placed in this encounter.    Raliegh Ip, DO [facility] [phone]

## 2022-09-28 LAB — CMP14+EGFR
ALT: 17 IU/L (ref 0–32)
AST: 19 IU/L (ref 0–40)
Albumin/Globulin Ratio: 1.8 (ref 1.2–2.2)
Albumin: 4.5 g/dL (ref 3.8–4.8)
Alkaline Phosphatase: 86 IU/L (ref 44–121)
BUN/Creatinine Ratio: 19 (ref 12–28)
BUN: 17 mg/dL (ref 8–27)
Bilirubin Total: 1 mg/dL (ref 0.0–1.2)
Calcium: 9.7 mg/dL (ref 8.7–10.3)
Glucose: 104 mg/dL — ABNORMAL HIGH (ref 70–99)
Potassium: 4.9 mmol/L (ref 3.5–5.2)
Total Protein: 7 g/dL (ref 6.0–8.5)

## 2022-09-28 LAB — CBC WITH DIFFERENTIAL/PLATELET
Basophils Absolute: 0.1 10*3/uL (ref 0.0–0.2)
Basos: 1 %
EOS (ABSOLUTE): 0.1 10*3/uL (ref 0.0–0.4)
Eos: 1 %
Hematocrit: 44.2 % (ref 34.0–46.6)
Hemoglobin: 14.4 g/dL (ref 11.1–15.9)
Immature Grans (Abs): 0 10*3/uL (ref 0.0–0.1)
Immature Granulocytes: 0 %
Lymphocytes Absolute: 1.1 10*3/uL (ref 0.7–3.1)
Lymphs: 12 %
MCH: 29.8 pg (ref 26.6–33.0)
MCHC: 32.6 g/dL (ref 31.5–35.7)
MCV: 92 fL (ref 79–97)
Monocytes Absolute: 0.5 10*3/uL (ref 0.1–0.9)
Monocytes: 5 %
Neutrophils Absolute: 7.4 10*3/uL — ABNORMAL HIGH (ref 1.4–7.0)
Neutrophils: 81 %
Platelets: 264 10*3/uL (ref 150–450)
RBC: 4.83 x10E6/uL (ref 3.77–5.28)
RDW: 13.5 % (ref 11.7–15.4)
WBC: 9.1 10*3/uL (ref 3.4–10.8)

## 2022-09-28 LAB — VITAMIN D 25 HYDROXY (VIT D DEFICIENCY, FRACTURES): Vit D, 25-Hydroxy: 41.5 ng/mL (ref 30.0–100.0)

## 2022-09-28 LAB — TSH: TSH: 1.55 u[IU]/mL (ref 0.450–4.500)

## 2022-09-30 LAB — URINE CULTURE

## 2022-10-07 ENCOUNTER — Encounter: Payer: Self-pay | Admitting: Family Medicine

## 2022-10-11 DIAGNOSIS — Z87440 Personal history of urinary (tract) infections: Secondary | ICD-10-CM | POA: Diagnosis not present

## 2022-10-11 DIAGNOSIS — N3000 Acute cystitis without hematuria: Secondary | ICD-10-CM | POA: Diagnosis not present

## 2022-10-25 DIAGNOSIS — N3000 Acute cystitis without hematuria: Secondary | ICD-10-CM | POA: Diagnosis not present

## 2022-11-24 ENCOUNTER — Other Ambulatory Visit: Payer: Self-pay | Admitting: Family Medicine

## 2022-11-24 DIAGNOSIS — G8929 Other chronic pain: Secondary | ICD-10-CM

## 2022-12-14 DIAGNOSIS — N200 Calculus of kidney: Secondary | ICD-10-CM | POA: Diagnosis not present

## 2022-12-14 DIAGNOSIS — K429 Umbilical hernia without obstruction or gangrene: Secondary | ICD-10-CM | POA: Diagnosis not present

## 2022-12-14 DIAGNOSIS — K5730 Diverticulosis of large intestine without perforation or abscess without bleeding: Secondary | ICD-10-CM | POA: Diagnosis not present

## 2022-12-14 DIAGNOSIS — N321 Vesicointestinal fistula: Secondary | ICD-10-CM | POA: Diagnosis not present

## 2022-12-14 DIAGNOSIS — N390 Urinary tract infection, site not specified: Secondary | ICD-10-CM | POA: Diagnosis not present

## 2022-12-14 DIAGNOSIS — N3281 Overactive bladder: Secondary | ICD-10-CM | POA: Diagnosis not present

## 2022-12-14 DIAGNOSIS — N3000 Acute cystitis without hematuria: Secondary | ICD-10-CM | POA: Diagnosis not present

## 2022-12-18 ENCOUNTER — Other Ambulatory Visit: Payer: Self-pay | Admitting: Family Medicine

## 2022-12-18 DIAGNOSIS — I10 Essential (primary) hypertension: Secondary | ICD-10-CM

## 2023-01-02 DIAGNOSIS — D3132 Benign neoplasm of left choroid: Secondary | ICD-10-CM | POA: Diagnosis not present

## 2023-01-02 DIAGNOSIS — Z961 Presence of intraocular lens: Secondary | ICD-10-CM | POA: Diagnosis not present

## 2023-01-02 DIAGNOSIS — H5203 Hypermetropia, bilateral: Secondary | ICD-10-CM | POA: Diagnosis not present

## 2023-01-02 DIAGNOSIS — H40013 Open angle with borderline findings, low risk, bilateral: Secondary | ICD-10-CM | POA: Diagnosis not present

## 2023-01-06 DIAGNOSIS — N321 Vesicointestinal fistula: Secondary | ICD-10-CM | POA: Diagnosis not present

## 2023-01-18 DIAGNOSIS — N390 Urinary tract infection, site not specified: Secondary | ICD-10-CM | POA: Diagnosis not present

## 2023-01-18 DIAGNOSIS — N3281 Overactive bladder: Secondary | ICD-10-CM | POA: Diagnosis not present

## 2023-01-18 DIAGNOSIS — N321 Vesicointestinal fistula: Secondary | ICD-10-CM | POA: Diagnosis not present

## 2023-01-31 DIAGNOSIS — E785 Hyperlipidemia, unspecified: Secondary | ICD-10-CM | POA: Diagnosis not present

## 2023-01-31 DIAGNOSIS — Z7982 Long term (current) use of aspirin: Secondary | ICD-10-CM | POA: Diagnosis not present

## 2023-01-31 DIAGNOSIS — I4510 Unspecified right bundle-branch block: Secondary | ICD-10-CM | POA: Diagnosis not present

## 2023-01-31 DIAGNOSIS — K660 Peritoneal adhesions (postprocedural) (postinfection): Secondary | ICD-10-CM | POA: Diagnosis not present

## 2023-01-31 DIAGNOSIS — Z79899 Other long term (current) drug therapy: Secondary | ICD-10-CM | POA: Diagnosis not present

## 2023-01-31 DIAGNOSIS — Z880 Allergy status to penicillin: Secondary | ICD-10-CM | POA: Diagnosis not present

## 2023-01-31 DIAGNOSIS — K5732 Diverticulitis of large intestine without perforation or abscess without bleeding: Secondary | ICD-10-CM | POA: Diagnosis not present

## 2023-01-31 DIAGNOSIS — Z9049 Acquired absence of other specified parts of digestive tract: Secondary | ICD-10-CM | POA: Diagnosis not present

## 2023-01-31 DIAGNOSIS — K5730 Diverticulosis of large intestine without perforation or abscess without bleeding: Secondary | ICD-10-CM | POA: Diagnosis not present

## 2023-01-31 DIAGNOSIS — Z87440 Personal history of urinary (tract) infections: Secondary | ICD-10-CM | POA: Diagnosis not present

## 2023-01-31 DIAGNOSIS — N321 Vesicointestinal fistula: Secondary | ICD-10-CM | POA: Diagnosis not present

## 2023-01-31 DIAGNOSIS — Z9889 Other specified postprocedural states: Secondary | ICD-10-CM | POA: Diagnosis not present

## 2023-01-31 DIAGNOSIS — I739 Peripheral vascular disease, unspecified: Secondary | ICD-10-CM | POA: Diagnosis not present

## 2023-01-31 DIAGNOSIS — I10 Essential (primary) hypertension: Secondary | ICD-10-CM | POA: Diagnosis not present

## 2023-02-06 ENCOUNTER — Telehealth: Payer: Self-pay

## 2023-02-06 NOTE — Transitions of Care (Post Inpatient/ED Visit) (Signed)
02/06/2023  Name: Maria Briggs MRN: [id number] DOB: 1951/03/25  Today's TOC FU Call Status: Today's TOC FU Call Status:: Successful TOC FU Call Completed TOC FU Call Complete Date: 02/06/23 Patient's Name and Date of Birth confirmed.  Transition Care Management Follow-up Telephone Call Date of Discharge: 02/06/23 Discharge Facility: Other [facility]) Name of Other ([facility]) Discharge Facility: [facility] Type of Discharge: Inpatient Admission Primary Inpatient Discharge Diagnosis:: Colovescical Fistula How have you been since you were released from the hospital?: Better Any questions or concerns?: No  Items Reviewed: Did you receive and understand the discharge instructions provided?: Yes Medications obtained,verified, and reconciled?: Yes (Medications Reviewed) Any new allergies since your discharge?: No Dietary orders reviewed?: Yes Type of Diet Ordered:: Lw sodium, heart healthy Do you have support at home?: Yes People in Home: spouse Name of Support/Comfort Primary Source: Aurther Loft  Medications Reviewed Today: Medications Reviewed Today     Reviewed by Jodelle Gross, RN (Case Manager) on 02/06/23 at 1143  Med List Status: <None>   Medication Order Taking? Sig Documenting Provider Last Dose Status Informant  acetaminophen (TYLENOL) 500 MG tablet [id number] Yes Take 1,000 mg by mouth every 6 (six) hours as needed for moderate pain. [provider]  Active   amLODipine (NORVASC) 10 MG tablet [id number] Yes TAKE 1 TABLET BY MOUTH EVERY DAY Raliegh Ip, DO Taking Active   aspirin 81 MG tablet [id number] Yes Take 81 mg by mouth daily. [provider] Taking Active Self  Biotin 5 MG CAPS [id number] Yes Take 1 capsule by mouth daily.  [provider] Taking Active            Med Note Abbe Amsterdam, AMY E   Tue Aug 17, 2021  9:09 AM) Leanora Ivanoff 5000mg   estradiol (ESTRACE) 0.1 MG/GM vaginal cream [id number] Yes Place 1 Applicatorful vaginally every  other day. [provider] Taking Active   meloxicam (MOBIC) 15 MG tablet [id number] Yes TAKE 1/2 TO 1 TABLET (7.5-15 MG TOTAL) BY MOUTH DAILY Delynn Flavin M, DO Taking Active   minoxidil (ROGAINE) 2 % external solution [id number] Yes Apply topically 2 (two) times daily. [provider] Taking Active   NEXLIZET 180-10 MG TABS [id number] Yes Take 1 tablet by mouth daily. Raliegh Ip, DO Taking Active   Omega-3 Fatty Acids (FISH OIL PO) [id number] Yes Take 1,000 mg by mouth 2 (two) times daily. [provider] Taking Active Self  polyethylene glycol powder (GLYCOLAX/MIRALAX) 17 GM/SCOOP powder [id number] Yes Take 17 g by mouth as needed. [provider] Taking Active             Home Care and Equipment/Supplies: Were Home Health Services Ordered?: No Any new equipment or medical supplies ordered?: No  Functional Questionnaire: Do you need assistance with bathing/showering or dressing?: No Do you need assistance with meal preparation?: No Do you need assistance with eating?: No Do you have difficulty maintaining continence: No Do you need assistance with getting out of bed/getting out of a chair/moving?: No Do you have difficulty managing or taking your medications?: No  Follow up appointments reviewed: PCP Follow-up appointment confirmed?: NA Specialist Hospital Follow-up appointment confirmed?: Yes Date of Specialist follow-up appointment?: 02/15/23 Follow-Up Specialty Provider:: Dr. Vita Barley (surgeon) Do you need transportation to your follow-up appointment?: No Do you understand care options if your condition(s) worsen?: Yes-patient verbalized understanding  SDOH Interventions Today    Flowsheet Row Most Recent Value  SDOH Interventions   Food Insecurity Interventions Intervention Not Indicated  Housing Interventions  Intervention Not Indicated  Transportation Interventions Intervention Not Indicated  Utilities Interventions  Intervention Not Indicated      Jodelle Gross RN, BSN, CCM [facility] RN Care Coordinator/ Transitions of Care Direct Dial: [phone]  Fax: [phone]

## 2023-02-15 ENCOUNTER — Other Ambulatory Visit: Payer: Self-pay | Admitting: Family Medicine

## 2023-02-15 DIAGNOSIS — G8929 Other chronic pain: Secondary | ICD-10-CM

## 2023-02-15 DIAGNOSIS — Z09 Encounter for follow-up examination after completed treatment for conditions other than malignant neoplasm: Secondary | ICD-10-CM | POA: Diagnosis not present

## 2023-03-16 ENCOUNTER — Other Ambulatory Visit: Payer: Self-pay | Admitting: Family Medicine

## 2023-03-16 DIAGNOSIS — I10 Essential (primary) hypertension: Secondary | ICD-10-CM

## 2023-03-17 DIAGNOSIS — N321 Vesicointestinal fistula: Secondary | ICD-10-CM | POA: Diagnosis not present

## 2023-03-24 ENCOUNTER — Ambulatory Visit (INDEPENDENT_AMBULATORY_CARE_PROVIDER_SITE_OTHER): Payer: Medicare Other | Admitting: Family Medicine

## 2023-03-24 ENCOUNTER — Encounter: Payer: Self-pay | Admitting: Family Medicine

## 2023-03-24 VITALS — BP 153/74 | HR 72 | Temp 98.5°F | Ht 61.0 in | Wt 184.0 lb

## 2023-03-24 DIAGNOSIS — E7800 Pure hypercholesterolemia, unspecified: Secondary | ICD-10-CM

## 2023-03-24 DIAGNOSIS — I700 Atherosclerosis of aorta: Secondary | ICD-10-CM

## 2023-03-24 DIAGNOSIS — G720 Drug-induced myopathy: Secondary | ICD-10-CM | POA: Diagnosis not present

## 2023-03-24 DIAGNOSIS — I10 Essential (primary) hypertension: Secondary | ICD-10-CM | POA: Diagnosis not present

## 2023-03-24 LAB — LIPID PANEL
Chol/HDL Ratio: 2.4 ratio (ref 0.0–4.4)
Cholesterol, Total: 165 mg/dL (ref 100–199)
HDL: 69 mg/dL (ref >39)
LDL Chol Calc (NIH): 81 mg/dL (ref 0–99)
Triglycerides: 83 mg/dL (ref 0–149)
VLDL Cholesterol Cal: 15 mg/dL (ref 5–40)

## 2023-03-24 MED ORDER — NEXLIZET 180-10 MG PO TABS: 1.0000 | ORAL_TABLET | Freq: Every day | ORAL | 3 refills | Status: DC

## 2023-03-24 MED ORDER — AMLODIPINE BESYLATE 10 MG PO TABS
10.0000 mg | ORAL_TABLET | Freq: Every day | ORAL | 3 refills | Status: DC
Start: 2023-03-24 — End: 2023-10-31

## 2023-03-24 NOTE — Progress Notes (Signed)
Subjective: CC: Hypertension associate with hyperlipidemia PCP: Raliegh Ip, DO QVZ:DGLOVFI Maria Briggs is a 72 y.o. female presenting to clinic today for:  1.  Hypertension associate with hyperlipidemia She is compliant with losartan, Nexlizet.  No chest pain, shortness of breath, blurred vision.  No edema reported.   ROS: Per HPI  Allergies  Allergen Reactions   Penicillins Hives   Rosuvastatin Calcium Other (See Comments)    Muscle pain, weakness in limbs   Statins Other (See Comments)    Muscle pain, weakness in limbs   Past Medical History:  Diagnosis Date   Allergy    Arthritis    "maybe a little; comes & goes; various parts of my body" (02/20/2015)   Hyperlipidemia    Hypertension    PONV (postoperative nausea and vomiting)     Current Outpatient Medications:    aspirin 81 MG tablet, Take 81 mg by mouth daily., Disp: , Rfl:    Biotin 5 MG CAPS, Take 1 capsule by mouth daily. , Disp: , Rfl:    estradiol (ESTRACE) 0.1 MG/GM vaginal cream, Place 1 Applicatorful vaginally every other day., Disp: , Rfl:    meloxicam (MOBIC) 15 MG tablet, TAKE 1/2 TO 1 TABLET (7.5-15 MG TOTAL) BY MOUTH DAILY, Disp: 30 tablet, Rfl: 1   minoxidil (ROGAINE) 2 % external solution, Apply topically 2 (two) times daily., Disp: , Rfl:    Omega-3 Fatty Acids (FISH OIL PO), Take 1,000 mg by mouth 2 (two) times daily., Disp: , Rfl:    polyethylene glycol powder (GLYCOLAX/MIRALAX) 17 GM/SCOOP powder, Take 17 g by mouth as needed., Disp: , Rfl:    amLODipine (NORVASC) 10 MG tablet, Take 1 tablet (10 mg total) by mouth daily., Disp: 90 tablet, Rfl: 3   NEXLIZET 180-10 MG TABS, Take 1 tablet by mouth daily., Disp: 90 tablet, Rfl: 3 Social History   Socioeconomic History   Marital status: Married    Spouse name: Aurther Loft   Number of children: 2   Years of education: Not on file   Highest education level: 12th grade  Occupational History   Occupation: retired  Tobacco Use   Smoking status:  Never   Smokeless tobacco: Never  Vaping Use   Vaping status: Never Used  Substance and Sexual Activity   Alcohol use: Not Currently    Comment: 02/20/2015 "might have a drink q 6 months"   Drug use: No   Sexual activity: Not Currently  Other Topics Concern   Not on file  Social History Narrative   Retired, lives with husband Aurther Loft, 2 children, enjoys reading and puzzles   Social Determinants of Health   Financial Resource Strain: Low Risk  (03/20/2023)   Overall Financial Resource Strain (CARDIA)    Difficulty of Paying Living Expenses: Not hard at all  Food Insecurity: No Food Insecurity (03/20/2023)   Hunger Vital Sign    Worried About Running Out of Food in the Last Year: Never true    Ran Out of Food in the Last Year: Never true  Transportation Needs: No Transportation Needs (03/20/2023)   PRAPARE - Administrator, Civil Service (Medical): No    Lack of Transportation (Non-Medical): No  Physical Activity: Unknown (03/20/2023)   Exercise Vital Sign    Days of Exercise per Week: 2 days    Minutes of Exercise per Session: Patient declined  Stress: Stress Concern Present (03/20/2023)   [employer] of Occupational Health - Occupational Stress Questionnaire    Feeling  of Stress : To some extent  Social Connections: Moderately Integrated (03/20/2023)   Social Connection and Isolation Panel [NHANES]    Frequency of Communication with Friends and Family: More than three times a week    Frequency of Social Gatherings with Friends and Family: Twice a week    Attends Religious Services: 1 to 4 times per year    Active Member of [employer] or Organizations: No    Attends Banker Meetings: Never    Marital Status: Married  Catering manager Violence: Not At Risk (08/22/2022)   Humiliation, Afraid, Rape, and Kick questionnaire    Fear of Current or Ex-Partner: No    Emotionally Abused: No    Physically Abused: No    Sexually Abused: No   Family History   Problem Relation Age of Onset   Colon polyps Father    Colon cancer Father        ?   Prostate cancer Father    Dementia Father    COPD Father    Cancer Father    Anxiety disorder Mother    COPD Mother    Depression Mother    Heart attack Brother 59   Mood Disorder Son    Stroke Maternal Grandfather    Diabetes Maternal Grandfather    Stroke Paternal Grandmother    Diabetes Paternal Grandmother    Stroke Paternal Grandfather    Diabetes Paternal Grandfather    Esophageal cancer Neg Hx    Stomach cancer Neg Hx    Rectal cancer Neg Hx     Objective: Office vital signs reviewed. BP (!) 153/78   Pulse 72   Temp 98.5 F (36.9 C)   Ht 5\' 1"  (1.549 m)   Wt 184 lb (83.5 kg)   SpO2 96%   BMI 34.77 kg/m   Physical Examination:  General: Awake, alert, well nourished, No acute distress HEENT: sclera white, MMM Cardio: regular rate and rhythm, S1S2 heard, no murmurs appreciated Pulm: clear to auscultation bilaterally, no wheezes, rhonchi or rales; normal work of breathing on room air   Assessment/ Plan: 72 y.o. female   Essential hypertension - Plan: amLODipine (NORVASC) 10 MG tablet  Aortic atherosclerosis (HCC) - Plan: Lipid Panel, NEXLIZET 180-10 MG TABS  Pure hypercholesterolemia - Plan: Lipid Panel, NEXLIZET 180-10 MG TABS  Drug-induced myopathy - Plan: NEXLIZET 180-10 MG TABS  Blood pressure was not controlled upon repeat.  Continue Norvasc.  Renewal sent.  She will maintain blood pressure log at home and if persistently elevated, we will consider adding something like hydrochlorothiazide or ARB to regimen  Check lipid panel.  Continue next visit.  Written prescription provided to the patient and she will turn this in along with her patient assistance program forms.  Has history of statin induced myopathy send not able to take statin therapy for cholesterol control   Maria Harnack Hulen Skains, DO [facility] [phone]

## 2023-04-11 ENCOUNTER — Other Ambulatory Visit: Payer: Self-pay | Admitting: Family Medicine

## 2023-04-11 DIAGNOSIS — G8929 Other chronic pain: Secondary | ICD-10-CM

## 2023-04-25 DIAGNOSIS — N3281 Overactive bladder: Secondary | ICD-10-CM | POA: Diagnosis not present

## 2023-04-25 DIAGNOSIS — N390 Urinary tract infection, site not specified: Secondary | ICD-10-CM | POA: Diagnosis not present

## 2023-04-25 DIAGNOSIS — N321 Vesicointestinal fistula: Secondary | ICD-10-CM | POA: Diagnosis not present

## 2023-07-17 DIAGNOSIS — K432 Incisional hernia without obstruction or gangrene: Secondary | ICD-10-CM | POA: Diagnosis not present

## 2023-07-25 DIAGNOSIS — K432 Incisional hernia without obstruction or gangrene: Secondary | ICD-10-CM | POA: Diagnosis not present

## 2023-07-25 DIAGNOSIS — N200 Calculus of kidney: Secondary | ICD-10-CM | POA: Diagnosis not present

## 2023-07-25 DIAGNOSIS — K439 Ventral hernia without obstruction or gangrene: Secondary | ICD-10-CM | POA: Diagnosis not present

## 2023-07-25 DIAGNOSIS — K5730 Diverticulosis of large intestine without perforation or abscess without bleeding: Secondary | ICD-10-CM | POA: Diagnosis not present

## 2023-07-25 DIAGNOSIS — K429 Umbilical hernia without obstruction or gangrene: Secondary | ICD-10-CM | POA: Diagnosis not present

## 2023-08-09 DIAGNOSIS — K432 Incisional hernia without obstruction or gangrene: Secondary | ICD-10-CM | POA: Diagnosis not present

## 2023-08-25 DIAGNOSIS — Z1231 Encounter for screening mammogram for malignant neoplasm of breast: Secondary | ICD-10-CM | POA: Diagnosis not present

## 2023-08-25 LAB — HM MAMMOGRAPHY

## 2023-10-03 ENCOUNTER — Encounter: Payer: Medicare Other | Admitting: Family Medicine

## 2023-10-30 NOTE — Progress Notes (Unsigned)
 Maria Briggs is a 73 y.o. female presents to office today for annual physical exam examination.    Concerns today include: 1. ***  Occupation: ***, Marital status: ***, Substance use: *** Health Maintenance Due  Topic Date Due   Zoster Vaccines- Shingrix (1 of 2) Never done   DEXA SCAN  08/01/2022   COVID-19 Vaccine (5 - 2024-25 season) 01/22/2023   Medicare Annual Wellness (AWV)  08/22/2023   Refills needed today: ***  Immunization History  Administered Date(s) Administered   Fluad Quad(high Dose 65+) 02/15/2019, 03/16/2020, 03/04/2021, 04/05/2022   Influenza Split 03/25/2013   Influenza, High Dose Seasonal PF 03/10/2017, 03/22/2023   Influenza, Seasonal, Injecte, Preservative Fre 02/21/2015, 03/02/2016   Influenza,trivalent, recombinat, inj, PF 03/25/2013   Influenza-Unspecified 02/21/2015, 03/02/2016   PFIZER(Purple Top)SARS-COV-2 Vaccination 06/12/2019, 07/03/2019, 04/01/2020   PNEUMOCOCCAL CONJUGATE-20 12/31/2021   Pfizer Covid-19 Vaccine Bivalent Booster 66yrs & up 04/07/2021   Pneumococcal Polysaccharide-23 02/15/2018   Tdap 07/15/2020   Past Medical History:  Diagnosis Date   Allergy    Arthritis    "maybe a little; comes & goes; various parts of my body" (02/20/2015)   Hyperlipidemia    Hypertension    PONV (postoperative nausea and vomiting)    Social History   Socioeconomic History   Marital status: Married    Spouse name: Blaise Bumps   Number of children: 2   Years of education: Not on file   Highest education level: 12th grade  Occupational History   Occupation: retired  Tobacco Use   Smoking status: Never   Smokeless tobacco: Never  Vaping Use   Vaping status: Never Used  Substance and Sexual Activity   Alcohol use: Not Currently    Comment: 02/20/2015 "might have a drink q 6 months"   Drug use: No   Sexual activity: Not Currently  Other Topics Concern   Not on file  Social History Narrative   Retired, lives with husband Blaise Bumps, 2 children,  enjoys reading and puzzles   Social Drivers of Corporate investment banker Strain: Low Risk  (09/29/2023)   Overall Financial Resource Strain (CARDIA)    Difficulty of Paying Living Expenses: Not hard at all  Food Insecurity: No Food Insecurity (09/29/2023)   Hunger Vital Sign    Worried About Running Out of Food in the Last Year: Never true    Ran Out of Food in the Last Year: Never true  Transportation Needs: No Transportation Needs (09/29/2023)   PRAPARE - Administrator, Civil Service (Medical): No    Lack of Transportation (Non-Medical): No  Physical Activity: Insufficiently Active (09/29/2023)   Exercise Vital Sign    Days of Exercise per Week: 2 days    Minutes of Exercise per Session: 30 min  Stress: No Stress Concern Present (09/29/2023)   [employer] of Occupational Health - Occupational Stress Questionnaire    Feeling of Stress : Only a little  Social Connections: Moderately Integrated (09/29/2023)   Social Connection and Isolation Panel [NHANES]    Frequency of Communication with Friends and Family: More than three times a week    Frequency of Social Gatherings with Friends and Family: Once a week    Attends Religious Services: 1 to 4 times per year    Active Member of [employer] or Organizations: No    Attends Banker Meetings: Not on file    Marital Status: Married  Intimate Partner Violence: Not At Risk (08/22/2022)   Humiliation, Afraid, Rape, and  Kick questionnaire    Fear of Current or Ex-Partner: No    Emotionally Abused: No    Physically Abused: No    Sexually Abused: No   Past Surgical History:  Procedure Laterality Date   BACK SURGERY     BLADDER SUSPENSION  ~ 2005   CHOLECYSTECTOMY  05/24/1971   COLONOSCOPY     COLONOSCOPY W/ BIOPSIES AND POLYPECTOMY     ENDOMETRIAL ABLATION  2006?   EYE SURGERY     HERNIA REPAIR     INCISIONAL HERNIA REPAIR N/A 02/20/2015   Procedure: LAPAROSCOPIC INCISIONAL HERNIA REPAIR WITH MESH;  Surgeon: Oza Blumenthal, MD;  Location: [facility] OR;  Service: General;  Laterality: N/A;   INSERTION OF MESH N/A 02/20/2015   Procedure: INSERTION OF MESH;  Surgeon: Oza Blumenthal, MD;  Location: [facility] OR;  Service: General;  Laterality: N/A;   LAPAROSCOPIC INCISIONAL / UMBILICAL / VENTRAL HERNIA REPAIR  02/20/2015   incisional w/mesh   LUMBAR DISC SURGERY  05/24/1991   MOLE REMOVAL  01/22/1999   "off my back; no cancer"   SPINE SURGERY     Ruptured disc lower back   TONSILLECTOMY  05/23/1957   TUBAL LIGATION  05/23/1977   Family History  Problem Relation Age of Onset   Colon polyps Father    Colon cancer Father        ?   Prostate cancer Father    Dementia Father    COPD Father    Cancer Father    Anxiety disorder Mother    COPD Mother    Depression Mother    Heart attack Brother 55   Mood Disorder Son    Stroke Maternal Grandfather    Diabetes Maternal Grandfather    Stroke Paternal Grandmother    Diabetes Paternal Grandmother    Stroke Paternal Grandfather    Diabetes Paternal Grandfather    Esophageal cancer Neg Hx    Stomach cancer Neg Hx    Rectal cancer Neg Hx     Current Outpatient Medications:    amLODipine  (NORVASC ) 10 MG tablet, Take 1 tablet (10 mg total) by mouth daily., Disp: 90 tablet, Rfl: 3   aspirin 81 MG tablet, Take 81 mg by mouth daily., Disp: , Rfl:    Biotin 5 MG CAPS, Take 1 capsule by mouth daily. , Disp: , Rfl:    estradiol (ESTRACE) 0.1 MG/GM vaginal cream, Place 1 Applicatorful vaginally every other day., Disp: , Rfl:    meloxicam  (MOBIC ) 15 MG tablet, TAKE 1/2 TO 1 TABLET (7.5-15 MG TOTAL) BY MOUTH DAILY, Disp: 30 tablet, Rfl: 3   minoxidil (ROGAINE) 2 % external solution, Apply topically 2 (two) times daily., Disp: , Rfl:    NEXLIZET  180-10 MG TABS, Take 1 tablet by mouth daily., Disp: 90 tablet, Rfl: 3   Omega-3 Fatty Acids (FISH OIL PO), Take 1,000 mg by mouth 2 (two) times daily., Disp: , Rfl:    polyethylene glycol powder (GLYCOLAX/MIRALAX) 17 GM/SCOOP  powder, Take 17 g by mouth as needed., Disp: , Rfl:   Allergies  Allergen Reactions   Penicillins Hives   Rosuvastatin Calcium Other (See Comments)    Muscle pain, weakness in limbs   Statins Other (See Comments)    Muscle pain, weakness in limbs     ROS: Review of Systems {ros; complete:30496}    Physical exam {Exam, Complete:[phone]}      03/24/2023    9:59 AM 09/27/2022    8:19 AM 08/22/2022    8:57 AM  Depression screen PHQ 2/9  Decreased Interest 0 0 0  Down, Depressed, Hopeless 0 0 0  PHQ - 2 Score 0 0 0  Altered sleeping 0 0   Tired, decreased energy 0 0   Change in appetite 0 0   Feeling bad or failure about yourself  0 0   Trouble concentrating 0 0   Moving slowly or fidgety/restless 0 0   Suicidal thoughts 0 0   PHQ-9 Score 0 0   Difficult doing work/chores Not difficult at all Not difficult at all       03/24/2023    9:59 AM 09/27/2022    8:18 AM 04/05/2022    8:29 AM 12/31/2021    8:06 AM  GAD 7 : Generalized Anxiety Score  Nervous, Anxious, on Edge 0 0 0 0  Control/stop worrying 0 0 0 0  Worry too much - different things 0 0 0 0  Trouble relaxing 0 0 0 0  Restless 0 0 0 0  Easily annoyed or irritable 0 0 0 0  Afraid - awful might happen 0 0  0  Total GAD 7 Score 0 0  0  Anxiety Difficulty Not difficult at all Not difficult at all Not difficult at all Not difficult at all     Assessment/ Plan: Deloria Fetch here for annual physical exam.   Annual physical exam  Essential hypertension  Pure hypercholesterolemia  Aortic atherosclerosis (HCC)  Drug-induced myopathy  Osteopenia of lumbar spine  ***  Counseled on healthy lifestyle choices, including diet (rich in fruits, vegetables and lean meats and low in salt and simple carbohydrates) and exercise (at least 30 minutes of moderate physical activity daily).  Patient to follow up ***  Moise Friday M. Bonnell Butcher, DO

## 2023-10-31 ENCOUNTER — Ambulatory Visit (INDEPENDENT_AMBULATORY_CARE_PROVIDER_SITE_OTHER): Admitting: Family Medicine

## 2023-10-31 ENCOUNTER — Encounter: Payer: Self-pay | Admitting: Family Medicine

## 2023-10-31 VITALS — BP 134/71 | HR 78 | Temp 98.5°F | Ht 61.0 in | Wt 183.0 lb

## 2023-10-31 DIAGNOSIS — Z0001 Encounter for general adult medical examination with abnormal findings: Secondary | ICD-10-CM

## 2023-10-31 DIAGNOSIS — M8588 Other specified disorders of bone density and structure, other site: Secondary | ICD-10-CM

## 2023-10-31 DIAGNOSIS — Z0000 Encounter for general adult medical examination without abnormal findings: Secondary | ICD-10-CM

## 2023-10-31 DIAGNOSIS — I10 Essential (primary) hypertension: Secondary | ICD-10-CM | POA: Diagnosis not present

## 2023-10-31 DIAGNOSIS — G720 Drug-induced myopathy: Secondary | ICD-10-CM | POA: Diagnosis not present

## 2023-10-31 DIAGNOSIS — K439 Ventral hernia without obstruction or gangrene: Secondary | ICD-10-CM

## 2023-10-31 DIAGNOSIS — E7800 Pure hypercholesterolemia, unspecified: Secondary | ICD-10-CM

## 2023-10-31 DIAGNOSIS — I700 Atherosclerosis of aorta: Secondary | ICD-10-CM | POA: Diagnosis not present

## 2023-10-31 DIAGNOSIS — Z01818 Encounter for other preprocedural examination: Secondary | ICD-10-CM

## 2023-10-31 MED ORDER — AMLODIPINE BESYLATE 10 MG PO TABS: 10.0000 mg | ORAL_TABLET | Freq: Every day | ORAL | 3 refills | Status: AC

## 2023-10-31 NOTE — Patient Instructions (Signed)
 Preventive Care 43 Years and Older, Female Preventive care refers to lifestyle choices and visits with your health care provider that can promote health and wellness. Preventive care visits are also called wellness exams. What can I expect for my preventive care visit? Counseling Your health care provider may ask you questions about your: Medical history, including: Past medical problems. Family medical history. Pregnancy and menstrual history. History of falls. Current health, including: Memory and ability to understand (cognition). Emotional well-being. Home life and relationship well-being. Sexual activity and sexual health. Lifestyle, including: Alcohol, nicotine or tobacco, and drug use. Access to firearms. Diet, exercise, and sleep habits. Work and work Astronomer. Sunscreen use. Safety issues such as seatbelt and bike helmet use. Physical exam Your health care provider will check your: Height and weight. These may be used to calculate your BMI (body mass index). BMI is a measurement that tells if you are at a healthy weight. Waist circumference. This measures the distance around your waistline. This measurement also tells if you are at a healthy weight and may help predict your risk of certain diseases, such as type 2 diabetes and high blood pressure. Heart rate and blood pressure. Body temperature. Skin for abnormal spots. What immunizations do I need?  Vaccines are usually given at various ages, according to a schedule. Your health care provider will recommend vaccines for you based on your age, medical history, and lifestyle or other factors, such as travel or where you work. What tests do I need? Screening Your health care provider may recommend screening tests for certain conditions. This may include: Lipid and cholesterol levels. Hepatitis C test. Hepatitis B test. HIV (human immunodeficiency virus) test. STI (sexually transmitted infection) testing, if you are at  risk. Lung cancer screening. Colorectal cancer screening. Diabetes screening. This is done by checking your blood sugar (glucose) after you have not eaten for a while (fasting). Mammogram. Talk with your health care provider about how often you should have regular mammograms. BRCA-related cancer screening. This may be done if you have a family history of breast, ovarian, tubal, or peritoneal cancers. Bone density scan. This is done to screen for osteoporosis. Talk with your health care provider about your test results, treatment options, and if necessary, the need for more tests. Follow these instructions at home: Eating and drinking  Eat a diet that includes fresh fruits and vegetables, whole grains, lean protein, and low-fat dairy products. Limit your intake of foods with high amounts of sugar, saturated fats, and salt. Take vitamin and mineral supplements as recommended by your health care provider. Do not drink alcohol if your health care provider tells you not to drink. If you drink alcohol: Limit how much you have to 0-1 drink a day. Know how much alcohol is in your drink. In the [place] one drink equals one 12 oz bottle of beer (355 mL), one 5 oz glass of wine (148 mL), or one 1 oz glass of hard liquor (44 mL). Lifestyle Brush your teeth every morning and night with fluoride toothpaste. Floss one time each day. Exercise for at least 30 minutes 5 or more days each week. Do not use any products that contain nicotine or tobacco. These products include cigarettes, chewing tobacco, and vaping devices, such as e-cigarettes. If you need help quitting, ask your health care provider. Do not use drugs. If you are sexually active, practice safe sex. Use a condom or other form of protection in order to prevent STIs. Take aspirin only as told by  your health care provider. Make sure that you understand how much to take and what form to take. Work with your health care provider to find out whether it  is safe and beneficial for you to take aspirin daily. Ask your health care provider if you need to take a cholesterol-lowering medicine (statin). Find healthy ways to manage stress, such as: Meditation, yoga, or listening to music. Journaling. Talking to a trusted person. Spending time with friends and family. Minimize exposure to UV radiation to reduce your risk of skin cancer. Safety Always wear your seat belt while driving or riding in a vehicle. Do not drive: If you have been drinking alcohol. Do not ride with someone who has been drinking. When you are tired or distracted. While texting. If you have been using any mind-altering substances or drugs. Wear a helmet and other protective equipment during sports activities. If you have firearms in your house, make sure you follow all gun safety procedures. What's next? Visit your health care provider once a year for an annual wellness visit. Ask your health care provider how often you should have your eyes and teeth checked. Stay up to date on all vaccines. This information is not intended to replace advice given to you by your health care provider. Make sure you discuss any questions you have with your health care provider. Document Revised: 11/04/2020 Document Reviewed: 11/04/2020 Elsevier Patient Education  2024 [employer].

## 2023-11-08 ENCOUNTER — Ambulatory Visit (INDEPENDENT_AMBULATORY_CARE_PROVIDER_SITE_OTHER)

## 2023-11-08 DIAGNOSIS — M8588 Other specified disorders of bone density and structure, other site: Secondary | ICD-10-CM | POA: Diagnosis not present

## 2023-11-09 DIAGNOSIS — Z780 Asymptomatic menopausal state: Secondary | ICD-10-CM | POA: Diagnosis not present

## 2023-11-09 DIAGNOSIS — M8589 Other specified disorders of bone density and structure, multiple sites: Secondary | ICD-10-CM | POA: Diagnosis not present

## 2023-11-10 ENCOUNTER — Ambulatory Visit: Payer: Self-pay | Admitting: Family Medicine

## 2023-11-15 DIAGNOSIS — K432 Incisional hernia without obstruction or gangrene: Secondary | ICD-10-CM | POA: Diagnosis not present

## 2023-12-07 ENCOUNTER — Ambulatory Visit

## 2023-12-07 VITALS — BP 134/71 | HR 78 | Ht 61.0 in | Wt 183.0 lb

## 2023-12-07 DIAGNOSIS — Z0000 Encounter for general adult medical examination without abnormal findings: Secondary | ICD-10-CM

## 2023-12-07 NOTE — Progress Notes (Signed)
 Subjective:   Maria Briggs is a 73 y.o. who presents for a Medicare Wellness preventive visit.  As a reminder, Annual Wellness Visits don't include a physical exam, and some assessments may be limited, especially if this visit is performed virtually. We may recommend an in-person follow-up visit with your provider if needed.  Visit Complete: Virtual I connected with  Maria Briggs on 12/07/23 by a audio enabled telemedicine application and verified that I am speaking with the correct person using two identifiers.  Patient Location: Home  Provider Location: Home Office  I discussed the limitations of evaluation and management by telemedicine. The patient expressed understanding and agreed to proceed.  Vital Signs: Because this visit was a virtual/telehealth visit, some criteria may be missing or patient reported. Any vitals not documented were not able to be obtained and vitals that have been documented are patient reported.  VideoDeclined- This patient declined Librarian, academic. Therefore the visit was completed with audio only.  Persons Participating in Visit: Patient.  AWV Questionnaire: Yes: Patient Medicare AWV questionnaire was completed by the patient on 12/03/23; I have confirmed that all information answered by patient is correct and no changes since this date.  Cardiac Risk Factors include: advanced age (>49men, >74 women);obesity (BMI >30kg/m2);hypertension     Objective:    Today's Vitals   12/07/23 0847  BP: 134/71  Pulse: 78  Weight: 183 lb (83 kg)  Height: 5' 1 (1.549 m)   Body mass index is 34.58 kg/m.     12/07/2023    8:42 AM 08/22/2022    9:00 AM 08/17/2021    9:29 AM 07/22/2020    9:07 AM 04/08/2019   12:52 PM 03/24/2017    7:51 AM 02/20/2015   10:27 PM  Advanced Directives  Does Patient Have a Medical Advance Directive? No;Yes Yes Yes Yes Yes Yes  Yes   Type of Estate agent of [place];Living  will  Healthcare Power of [place];Living will Healthcare Power of [place];Living will;Out of facility DNR (pink MOST or yellow form)  Healthcare Power of [place];Living will Living will;Healthcare Power of Attorney   Does patient want to make changes to medical advance directive?  No - Patient declined  No - Patient declined   No - Patient declined   Copy of Healthcare Power of Attorney in Chart? No - copy requested  No - copy requested No - copy requested   No - copy requested      Data saved with a previous flowsheet row definition    Current Medications (verified) Outpatient Encounter Medications as of 12/07/2023  Medication Sig   amLODipine  (NORVASC ) 10 MG tablet Take 1 tablet (10 mg total) by mouth daily.   aspirin 81 MG tablet Take 81 mg by mouth daily.   Bacillus Coagulans-Guar Gum (BENEFIBER ADVANCED PO) Take by mouth.   Biotin 5 MG CAPS Take 1 capsule by mouth daily.    estradiol (ESTRACE) 0.1 MG/GM vaginal cream Place 1 Applicatorful vaginally every other day.   meloxicam  (MOBIC ) 15 MG tablet TAKE 1/2 TO 1 TABLET (7.5-15 MG TOTAL) BY MOUTH DAILY   minoxidil (ROGAINE) 2 % external solution Apply topically 2 (two) times daily.   NEXLIZET  180-10 MG TABS Take 1 tablet by mouth daily.   Omega-3 Fatty Acids (FISH OIL PO) Take 1,000 mg by mouth 2 (two) times daily.   No facility-administered encounter medications on file as of 12/07/2023.    Allergies (verified) Penicillins, Rosuvastatin calcium, and Statins  History: Past Medical History:  Diagnosis Date   Allergy    Arthritis    maybe a little; comes & goes; various parts of my body (02/20/2015)   Hyperlipidemia    Hypertension    PONV (postoperative nausea and vomiting)    Past Surgical History:  Procedure Laterality Date   BACK SURGERY     BLADDER SUSPENSION  ~ 2005   CHOLECYSTECTOMY  05/24/1971   COLONOSCOPY     COLONOSCOPY W/ BIOPSIES AND POLYPECTOMY     ENDOMETRIAL ABLATION  2006?   EYE SURGERY     Cataracts    HERNIA REPAIR     INCISIONAL HERNIA REPAIR N/A 02/20/2015   Procedure: LAPAROSCOPIC INCISIONAL HERNIA REPAIR WITH MESH;  Surgeon: Vicenta Poli, MD;  Location: [facility] OR;  Service: General;  Laterality: N/A;   INSERTION OF MESH N/A 02/20/2015   Procedure: INSERTION OF MESH;  Surgeon: Vicenta Poli, MD;  Location: [facility] OR;  Service: General;  Laterality: N/A;   LAPAROSCOPIC INCISIONAL / UMBILICAL / VENTRAL HERNIA REPAIR  02/20/2015   incisional w/mesh   LUMBAR DISC SURGERY  05/24/1991   MOLE REMOVAL  01/22/1999   off my back; no cancer   SPINE SURGERY     Ruptured disc lower back   TONSILLECTOMY  05/23/1957   TUBAL LIGATION  05/23/1977   Family History  Problem Relation Age of Onset   Colon polyps Father    Colon cancer Father        ?   Prostate cancer Father    Dementia Father    COPD Father    Cancer Father    Anxiety disorder Mother    COPD Mother    Depression Mother    Heart attack Brother 64   Mood Disorder Son    Stroke Maternal Grandfather    Diabetes Maternal Grandfather    Stroke Paternal Grandmother    Diabetes Paternal Grandmother    Stroke Paternal Grandfather    Diabetes Paternal Grandfather    Esophageal cancer Neg Hx    Stomach cancer Neg Hx    Rectal cancer Neg Hx    Social History   Socioeconomic History   Marital status: Married    Spouse name: Jerel   Number of children: 2   Years of education: Not on file   Highest education level: 12th grade  Occupational History   Occupation: retired  Tobacco Use   Smoking status: Never   Smokeless tobacco: Never  Vaping Use   Vaping status: Never Used  Substance and Sexual Activity   Alcohol use: Not Currently    Comment: 02/20/2015 might have a drink q 6 months   Drug use: No   Sexual activity: Not Currently    Birth control/protection: None  Other Topics Concern   Not on file  Social History Narrative   Retired, lives with husband Jerel, 2 children, enjoys reading and puzzles   Social  Drivers of Corporate investment banker Strain: Low Risk  (12/03/2023)   Overall Financial Resource Strain (CARDIA)    Difficulty of Paying Living Expenses: Not hard at all  Food Insecurity: No Food Insecurity (12/03/2023)   Hunger Vital Sign    Worried About Running Out of Food in the Last Year: Never true    Ran Out of Food in the Last Year: Never true  Transportation Needs: No Transportation Needs (12/03/2023)   PRAPARE - Administrator, Civil Service (Medical): No    Lack of Transportation (Non-Medical): No  Physical Activity: Insufficiently Active (12/03/2023)   Exercise Vital Sign    Days of Exercise per Week: 2 days    Minutes of Exercise per Session: 10 min  Stress: No Stress Concern Present (12/03/2023)   [employer] of Occupational Health - Occupational Stress Questionnaire    Feeling of Stress: Only a little  Social Connections: Moderately Integrated (12/03/2023)   Social Connection and Isolation Panel    Frequency of Communication with Friends and Family: More than three times a week    Frequency of Social Gatherings with Friends and Family: Once a week    Attends Religious Services: 1 to 4 times per year    Active Member of [employer] or Organizations: No    Attends Engineer, structural: Not on file    Marital Status: Married    Tobacco Counseling Counseling given: Yes    Clinical Intake:  Pre-visit preparation completed: Yes  Pain : No/denies pain     BMI - recorded: 34.58 Nutritional Status: BMI > 30  Obese Nutritional Risks: None Diabetes: No  Lab Results  Component Value Date   HGBA1C 5.4 09/27/2022   HGBA1C 4.8 08/30/2021   HGBA1C 5.4 03/16/2020     How often do you need to have someone help you when you read instructions, pamphlets, or other written materials from your doctor or pharmacy?: 1 - Never  Interpreter Needed?: No  Information entered by :: alia t/cma   Activities of Daily Living     12/03/2023    6:10 PM  In  your present state of health, do you have any difficulty performing the following activities:  Hearing? 0  Vision? 0  Difficulty concentrating or making decisions? 0  Walking or climbing stairs? 0  Dressing or bathing? 0  Doing errands, shopping? 0  Preparing Food and eating ? N  Using the Toilet? N  In the past six months, have you accidently leaked urine? Y  Do you have problems with loss of bowel control? N  Managing your Medications? N  Managing your Finances? N  Housekeeping or managing your Housekeeping? N    Patient Care Team: Jolinda Norene HERO, DO as PCP - General (Family Medicine) Billee Mliss BIRCH, [facility] as Pharmacist (Family Medicine) Shona Rush, MD (Dermatology) Abran Rush SAILOR, MD as Consulting Physician (Gastroenterology) [facility]  I have updated your Care Teams any recent Medical Services you may have received from other providers in the past year.     Assessment:   This is a routine wellness examination for Maria Briggs.  Hearing/Vision screen Hearing Screening - Comments:: Pt have some hearing loss Vision Screening - Comments:: Pt wear glasses vision dif w/near sight/pt goes Dr. Blanche of 8/24   Goals Addressed             This Visit's Progress    Patient Stated       Pt wants to loose some wt ie 30-40lbs       Depression Screen     12/07/2023    8:43 AM 10/31/2023    9:17 AM 03/24/2023    9:59 AM 09/27/2022    8:19 AM 08/22/2022    8:57 AM 04/05/2022    8:29 AM 12/31/2021    8:06 AM  PHQ 2/9 Scores  PHQ - 2 Score 0 0 0 0 0 0 0  PHQ- 9 Score 0 0 0 0       Fall Risk     12/03/2023    6:10 PM 10/31/2023  9:18 AM 08/22/2022    8:56 AM 08/18/2022    9:07 AM 04/19/2022    9:43 AM  Fall Risk   Falls in the past year? 0 0 0 0 0  Number falls in past yr:  0 0    Injury with Fall?  0 0    Risk for fall due to :  No Fall Risks No Fall Risks    Follow up  Falls evaluation completed Falls prevention discussed;Education provided;Falls  evaluation completed  Falls evaluation completed      Data saved with a previous flowsheet row definition    MEDICARE RISK AT HOME:  Medicare Risk at Home Any stairs in or around the home?: (Patient-Rptd) No Home free of loose throw rugs in walkways, pet beds, electrical cords, etc?: (Patient-Rptd) Yes Adequate lighting in your home to reduce risk of falls?: (Patient-Rptd) Yes Life alert?: (Patient-Rptd) No Use of a cane, walker or w/c?: (Patient-Rptd) No Grab bars in the bathroom?: (Patient-Rptd) Yes Shower chair or bench in shower?: (Patient-Rptd) Yes Elevated toilet seat or a handicapped toilet?: (Patient-Rptd) Yes  TIMED UP AND GO:  Was the test performed?  no  Cognitive Function: 6CIT completed        12/07/2023    8:44 AM 08/22/2022    9:03 AM 07/22/2020    9:10 AM  6CIT Screen  What Year? 0 points 0 points 0 points  What month? 0 points 0 points 0 points  What time? 0 points 0 points 0 points  Count back from 20 0 points 0 points 0 points  Months in reverse 0 points 0 points 0 points  Repeat phrase 0 points 0 points 0 points  Total Score 0 points 0 points 0 points    Immunizations Immunization History  Administered Date(s) Administered   Fluad Quad(high Dose 65+) 02/15/2019, 03/16/2020, 03/04/2021, 04/05/2022   Influenza Split 03/25/2013   Influenza, High Dose Seasonal PF 03/10/2017, 03/22/2023   Influenza, Seasonal, Injecte, Preservative Fre 02/21/2015, 03/02/2016   Influenza,trivalent, recombinat, inj, PF 03/25/2013   Influenza-Unspecified 02/21/2015, 03/02/2016   PFIZER(Purple Top)SARS-COV-2 Vaccination 06/12/2019, 07/03/2019, 04/01/2020   PNEUMOCOCCAL CONJUGATE-20 12/31/2021   Pfizer Covid-19 Vaccine Bivalent Booster 63yrs & up 04/07/2021   Pneumococcal Polysaccharide-23 02/15/2018   Tdap 07/15/2020    Screening Tests Health Maintenance  Topic Date Due   Zoster Vaccines- Shingrix (1 of 2) 01/31/2024 (Originally 09/12/1969)   Hepatitis C Screening   03/23/2024 (Originally 09/11/1968)   COVID-19 Vaccine (5 - 2024-25 season) 12/22/2024 (Originally 01/22/2023)   INFLUENZA VACCINE  12/22/2023   MAMMOGRAM  08/24/2024   Medicare Annual Wellness (AWV)  12/06/2024   DEXA SCAN  11/08/2025   Colonoscopy  08/05/2026   DTaP/Tdap/Td (2 - Td or Tdap) 07/15/2030   Pneumococcal Vaccine: 50+ Years  Completed   Hepatitis B Vaccines  Aged Out   HPV VACCINES  Aged Out   Meningococcal B Vaccine  Aged Out    Health Maintenance  There are no preventive care reminders to display for this patient.  Health Maintenance Items Addressed: See Nurse Notes at the end of this note  Additional Screening:  Vision Screening: Recommended annual ophthalmology exams for early detection of glaucoma and other disorders of the eye. Would you like a referral to an eye doctor? No    Dental Screening: Recommended annual dental exams for proper oral hygiene  Community Resource Referral / Chronic Care Management: CRR required this visit?  No   CCM required this visit?  No   Plan:  I have personally reviewed and noted the following in the patient's chart:   Medical and social history Use of alcohol, tobacco or illicit drugs  Current medications and supplements including opioid prescriptions. Patient is not currently taking opioid prescriptions. Functional ability and status Nutritional status Physical activity Advanced directives List of other physicians Hospitalizations, surgeries, and ER visits in previous 12 months Vitals Screenings to include cognitive, depression, and falls Referrals and appointments  In addition, I have reviewed and discussed with patient certain preventive protocols, quality metrics, and best practice recommendations. A written personalized care plan for preventive services as well as general preventive health recommendations were provided to patient.   Ozie Ned, CMA   12/07/2023   After Visit Summary: (MyChart) Due to this  being a telephonic visit, the after visit summary with patients personalized plan was offered to patient via MyChart   Notes: Nothing significant to report at this time.

## 2023-12-07 NOTE — Patient Instructions (Signed)
 Maria Briggs , Thank you for taking time out of your busy schedule to complete your Annual Wellness Visit with me. I enjoyed our conversation and look forward to speaking with you again next year. I, as well as your care team,  appreciate your ongoing commitment to your health goals. Please review the following plan we discussed and let me know if I can assist you in the future. Your Game plan/ To Do List    Follow up Visits: Next Medicare AWV with our clinical staff: 11/21/24 at 8:40a.m.   Next Office Visit with your provider: 11/01/24 at 8:00a.m.  Clinician Recommendations:  Aim for 30 minutes of exercise or brisk walking, 6-8 glasses of water, and 5 servings of fruits and vegetables each day.       This is a list of the screening recommended for you and due dates:  Health Maintenance  Topic Date Due   Medicare Annual Wellness Visit  08/22/2023   Zoster (Shingles) Vaccine (1 of 2) 01/31/2024*   Hepatitis C Screening  03/23/2024*   COVID-19 Vaccine (5 - 2024-25 season) 12/22/2024*   Flu Shot  12/22/2023   Mammogram  08/24/2024   DEXA scan (bone density measurement)  11/08/2025   Colon Cancer Screening  08/05/2026   DTaP/Tdap/Td vaccine (2 - Td or Tdap) 07/15/2030   Pneumococcal Vaccine for age over 23  Completed   Hepatitis B Vaccine  Aged Out   HPV Vaccine  Aged Out   Meningitis B Vaccine  Aged Out  *Topic was postponed. The date shown is not the original due date.    Advanced directives: (Copy Requested) Please bring a copy of your health care power of attorney and living will to the office to be added to your chart at your convenience. You can mail to [facility] [address] [place], [place] [address] or email to [email or web address] Advance Care Planning is important because it:  [x]  Makes sure you receive the medical care that is consistent with your values, goals, and preferences  [x]  It provides guidance to your family and loved ones and reduces their  decisional burden about whether or not they are making the right decisions based on your wishes.  Follow the link provided in your after visit summary or read over the paperwork we have mailed to you to help you started getting your Advance Directives in place. If you need assistance in completing these, please reach out to us  so that we can help you!  See attachments for Preventive Care and Fall Prevention Tips.

## 2023-12-12 DIAGNOSIS — R9431 Abnormal electrocardiogram [ECG] [EKG]: Secondary | ICD-10-CM | POA: Diagnosis not present

## 2023-12-12 DIAGNOSIS — I700 Atherosclerosis of aorta: Secondary | ICD-10-CM | POA: Diagnosis not present

## 2023-12-12 DIAGNOSIS — Z8249 Family history of ischemic heart disease and other diseases of the circulatory system: Secondary | ICD-10-CM | POA: Diagnosis not present

## 2023-12-12 DIAGNOSIS — K5730 Diverticulosis of large intestine without perforation or abscess without bleeding: Secondary | ICD-10-CM | POA: Diagnosis not present

## 2023-12-12 DIAGNOSIS — Z9049 Acquired absence of other specified parts of digestive tract: Secondary | ICD-10-CM | POA: Diagnosis not present

## 2023-12-12 DIAGNOSIS — Z7982 Long term (current) use of aspirin: Secondary | ICD-10-CM | POA: Diagnosis not present

## 2023-12-12 DIAGNOSIS — I10 Essential (primary) hypertension: Secondary | ICD-10-CM | POA: Diagnosis not present

## 2023-12-12 DIAGNOSIS — Z79899 Other long term (current) drug therapy: Secondary | ICD-10-CM | POA: Diagnosis not present

## 2023-12-12 DIAGNOSIS — R935 Abnormal findings on diagnostic imaging of other abdominal regions, including retroperitoneum: Secondary | ICD-10-CM | POA: Diagnosis not present

## 2023-12-12 DIAGNOSIS — K432 Incisional hernia without obstruction or gangrene: Secondary | ICD-10-CM | POA: Diagnosis not present

## 2023-12-12 DIAGNOSIS — Z823 Family history of stroke: Secondary | ICD-10-CM | POA: Diagnosis not present

## 2024-01-17 DIAGNOSIS — D3132 Benign neoplasm of left choroid: Secondary | ICD-10-CM | POA: Diagnosis not present

## 2024-01-17 DIAGNOSIS — H40013 Open angle with borderline findings, low risk, bilateral: Secondary | ICD-10-CM | POA: Diagnosis not present

## 2024-01-17 DIAGNOSIS — Z961 Presence of intraocular lens: Secondary | ICD-10-CM | POA: Diagnosis not present

## 2024-03-04 DIAGNOSIS — N200 Calculus of kidney: Secondary | ICD-10-CM | POA: Diagnosis not present

## 2024-03-04 DIAGNOSIS — R10A1 Flank pain, right side: Secondary | ICD-10-CM | POA: Diagnosis not present

## 2024-03-04 DIAGNOSIS — Z87440 Personal history of urinary (tract) infections: Secondary | ICD-10-CM | POA: Diagnosis not present

## 2024-03-30 ENCOUNTER — Other Ambulatory Visit: Payer: Self-pay | Admitting: *Deleted

## 2024-03-30 DIAGNOSIS — G8929 Other chronic pain: Secondary | ICD-10-CM

## 2024-04-25 ENCOUNTER — Other Ambulatory Visit: Payer: Self-pay | Admitting: Family Medicine

## 2024-04-25 ENCOUNTER — Telehealth: Payer: Self-pay | Admitting: Family Medicine

## 2024-04-25 DIAGNOSIS — E7800 Pure hypercholesterolemia, unspecified: Secondary | ICD-10-CM

## 2024-04-25 DIAGNOSIS — I700 Atherosclerosis of aorta: Secondary | ICD-10-CM

## 2024-04-25 DIAGNOSIS — G720 Drug-induced myopathy: Secondary | ICD-10-CM

## 2024-04-25 NOTE — Telephone Encounter (Signed)
 Grabbed samples, placed up front in cabinet. Patient notified.

## 2024-04-25 NOTE — Telephone Encounter (Signed)
 Pt would like samples of Nexlizet . Please call pt.

## 2024-11-01 ENCOUNTER — Encounter: Payer: Self-pay | Admitting: Family Medicine

## 2024-12-09 ENCOUNTER — Ambulatory Visit: Payer: Self-pay
